# Patient Record
Sex: Male | Born: 1940 | Race: White | Hispanic: No | Marital: Married | Smoking: Never smoker
Health system: Southern US, Community
[De-identification: ages and names within clinical notes are randomized; demographics above are authoritative.]

## PROBLEM LIST (undated history)

## (undated) DIAGNOSIS — E785 Hyperlipidemia, unspecified: Secondary | ICD-10-CM

## (undated) DIAGNOSIS — E871 Hypo-osmolality and hyponatremia: Secondary | ICD-10-CM

## (undated) DIAGNOSIS — I2510 Atherosclerotic heart disease of native coronary artery without angina pectoris: Secondary | ICD-10-CM

## (undated) DIAGNOSIS — E119 Type 2 diabetes mellitus without complications: Secondary | ICD-10-CM

## (undated) DIAGNOSIS — R42 Dizziness and giddiness: Secondary | ICD-10-CM

## (undated) HISTORY — DX: Type 2 diabetes mellitus without complications: E11.9

## (undated) HISTORY — DX: Dizziness and giddiness: R42

## (undated) HISTORY — DX: Hyperlipidemia, unspecified: E78.5

## (undated) HISTORY — DX: Hypo-osmolality and hyponatremia: E87.1

## (undated) HISTORY — DX: Atherosclerotic heart disease of native coronary artery without angina pectoris: I25.10

---
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
## 2006-10-08 ENCOUNTER — Emergency Department: Payer: Self-pay | Admitting: Emergency Medicine

## 2007-03-21 ENCOUNTER — Ambulatory Visit: Payer: Self-pay | Admitting: Cardiology

## 2007-09-27 ENCOUNTER — Ambulatory Visit: Payer: Self-pay | Admitting: Cardiology

## 2007-09-28 ENCOUNTER — Ambulatory Visit: Payer: Self-pay | Admitting: Cardiology

## 2007-09-28 ENCOUNTER — Inpatient Hospital Stay (HOSPITAL_BASED_OUTPATIENT_CLINIC_OR_DEPARTMENT_OTHER): Admission: RE | Admit: 2007-09-28 | Discharge: 2007-09-28 | Payer: Self-pay | Admitting: Cardiology

## 2007-10-12 ENCOUNTER — Ambulatory Visit: Payer: Self-pay | Admitting: Cardiology

## 2008-05-31 ENCOUNTER — Ambulatory Visit: Payer: Self-pay | Admitting: General Surgery

## 2008-06-14 ENCOUNTER — Ambulatory Visit: Payer: Self-pay | Admitting: General Surgery

## 2008-10-23 ENCOUNTER — Ambulatory Visit: Payer: Self-pay | Admitting: Cardiology

## 2008-12-28 ENCOUNTER — Encounter: Payer: Self-pay | Admitting: Cardiology

## 2009-04-23 ENCOUNTER — Ambulatory Visit: Payer: BLUE CROSS/BLUE SHIELD

## 2009-05-02 ENCOUNTER — Ambulatory Visit: Payer: BLUE CROSS/BLUE SHIELD | Admitting: Urology

## 2009-05-26 ENCOUNTER — Emergency Department: Payer: BLUE CROSS/BLUE SHIELD | Admitting: Emergency Medicine

## 2009-06-18 ENCOUNTER — Encounter: Payer: Self-pay | Admitting: Cardiology

## 2009-07-11 DIAGNOSIS — I2510 Atherosclerotic heart disease of native coronary artery without angina pectoris: Secondary | ICD-10-CM

## 2009-12-10 ENCOUNTER — Ambulatory Visit: Payer: Self-pay | Admitting: Cardiovascular Disease

## 2009-12-10 DIAGNOSIS — E119 Type 2 diabetes mellitus without complications: Secondary | ICD-10-CM | POA: Insufficient documentation

## 2010-04-01 ENCOUNTER — Encounter: Payer: Self-pay | Admitting: Cardiovascular Disease

## 2010-04-09 ENCOUNTER — Ambulatory Visit: Payer: Self-pay | Admitting: Cardiovascular Disease

## 2010-04-09 DIAGNOSIS — R42 Dizziness and giddiness: Secondary | ICD-10-CM

## 2010-04-09 DIAGNOSIS — E871 Hypo-osmolality and hyponatremia: Secondary | ICD-10-CM

## 2010-04-25 ENCOUNTER — Ambulatory Visit: Payer: BLUE CROSS/BLUE SHIELD | Admitting: Nephrology

## 2010-05-06 ENCOUNTER — Ambulatory Visit: Payer: BLUE CROSS/BLUE SHIELD | Admitting: Nephrology

## 2010-09-25 ENCOUNTER — Encounter: Payer: Self-pay | Admitting: Cardiovascular Disease

## 2010-09-30 ENCOUNTER — Encounter: Payer: Self-pay | Admitting: Cardiovascular Disease

## 2010-09-30 ENCOUNTER — Ambulatory Visit
Admission: RE | Admit: 2010-09-30 | Discharge: 2010-09-30 | Payer: Self-pay | Source: Home / Self Care | Attending: Cardiovascular Disease | Admitting: Cardiovascular Disease

## 2010-09-30 DIAGNOSIS — E785 Hyperlipidemia, unspecified: Secondary | ICD-10-CM | POA: Insufficient documentation

## 2010-10-14 NOTE — Progress Notes (Signed)
Summary: Office Visit  Office Visit   Imported By: [place] Carbo 04/11/2010 10:34:20  _____________________________________________________________________  External Attachment:    Type:   Image     Comment:   External Document

## 2010-10-14 NOTE — Consult Note (Signed)
Summary: Consultation Report  Consultation Report   Imported By: Harlon Flor 04/23/2010 09:42:25  _____________________________________________________________________  External Attachment:    Type:   Image     Comment:   External Document

## 2010-10-14 NOTE — Assessment & Plan Note (Signed)
Summary: EC6   Visit Type:  ec6 Referring Provider:  Daleen Squibb Primary Provider:  Blythe Stanford, MD  CC:  NO COMPLAINTS.  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 He has a history of nonobstructive coronary artery disease, mild irregularities in the LAD, 30% in the circumflex. Catheterization was performed for chronic dizziness. 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  His dizziness episodes have improved recently. He states that sometimes it waxes and wanes. Currently he feels well though he is having trouble with irritable bowel syndrome. He seems to have periods of loose stools and constipation. He has been tried to take fiber for his symptoms.  He denies any chest pain, shortness of breath and is otherwise active. His weight has increased a little but recently through the winter season as he has been less active.  Current Problems (verified): 1)  Cad, Native Vessel  (ICD-414.01)  Current Medications (verified): 1)  Aspirin Ec 325 Mg Tbec (Aspirin) .... Take One Tablet By Mouth Daily 2)  Lisinopril 5 Mg Tabs (Lisinopril) .... Take One Tablet By Mouth Daily 3)  Metformin Hcl 500 Mg Tabs (Metformin Hcl) .Marland Kitchen.. 1 By Mouth Two Times A Day 4)  Omeprazole 20 Mg Cpdr (Omeprazole) .Marland Kitchen.. 1 By Mouth Once Daily 5)  Seroquel 25 Mg Tabs (Quetiapine Fumarate) .Marland Kitchen.. 1 By Mouth Once Daily 6)  Simvastatin 20 Mg Tabs (Simvastatin) .... Take One Tablet By Mouth Daily At Bedtime  Allergies (verified): No Known Drug Allergies  Past History:  Past Medical History: Last updated: 07/11/2009 CAD, NATIVE VESSEL (ICD-414.01)    Family History: Last updated: 07/11/2009 Family History of CVA or Stroke:   Social History: Last updated: 07/11/2009 Full Time Married  Tobacco Use - No.  Alcohol Use - yes Regular Exercise - no Drug Use - no  Risk Factors: Exercise: no (07/11/2009)  Risk Factors: Smoking Status: never  (07/11/2009)  Review of Systems       The patient complains of weight gain.  The patient denies fever, vision loss, decreased hearing, hoarseness, chest pain, syncope, dyspnea on exertion, peripheral edema, prolonged cough, abdominal pain, incontinence, muscle weakness, depression, and enlarged lymph nodes.    Vital Signs:  Patient profile:   70 year old male Height:      69 inches Weight:      185.75 pounds BMI:     27.53 Pulse rate:   69 / minute Pulse rhythm:   regular BP sitting:   132 / 70  (left arm) Cuff size:   regular  Vitals Entered By: Mercer Pod (December 10, 2009 10:34 AM)  Physical Exam  General:  well-appearing gentleman in no apparent distress, HEENT exam is benign, oropharynx is clear, neck is supple with no JVP or carotid bruits, heart sounds are regular with S1-S2 and no murmurs appreciated, lungs are clear to auscultation with no wheezes Rales, abdominal exam is benign with no widened aortic pulsation, no significant lower extremity edema, pulses are equal and symmetrical in his upper and lower extremities, neurologic exam is grossly nonfocal.   EKG  Procedure date:  12/10/2009  Findings:      Normal sinus rhythm with rate 69 beats per minute, no significant ST or T wave changes.  Impression & Recommendations:  Problem # 1:  CAD, NATIVE VESSEL (ICD-414.01) history of nonobstructive coronary artery disease by cardiac catheter several years ago. Echocardiogram at that time showing normal systolic function, diastolic relaxation abnormality. He is asymptomatic and  feels well and is tolerating his simvastatin and aspirin with no problems. Also on low-dose lisinopril for his blood pressures well controlled.  We'll try to obtain a copy of his most recent lipid panel for her records.  His updated medication list for this problem includes:    Aspirin Ec 325 Mg Tbec (Aspirin) .Marland Kitchen... Take one tablet by mouth daily    Lisinopril 5 Mg Tabs (Lisinopril) .Marland Kitchen... Take  one tablet by mouth daily  Problem # 2:  DM (ICD-250.00) diabetes is being managed by Dr. Elease Hashimoto. We have given him a dietary guide to help with food selection.  His updated medication list for this problem includes:    Aspirin Ec 325 Mg Tbec (Aspirin) .Marland Kitchen... Take one tablet by mouth daily    Lisinopril 5 Mg Tabs (Lisinopril) .Marland Kitchen... Take one tablet by mouth daily    Metformin Hcl 500 Mg Tabs (Metformin hcl) .Marland Kitchen... 1 by mouth two times a day

## 2010-10-14 NOTE — Assessment & Plan Note (Signed)
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 He has a history of nonobstructive coronary artery disease, mild irregularities in the LAD, 30% in the circumflex. Catheterization was performed for chronic dizziness in 2009. 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  He reports that over the past 5 weeks, he has felt weak. Initially it started with a episode  of shaking all over his body though his wife did not see him shaking. He felt it in inside. it lasted for several seconds before it resolved. Since then, he reports he has not felt as well. He has had symptoms of polyuria , sometimes urinating every 45 minutes at nighttime. It has significantly disrupted his sleep hygiene and he feels exhausted. 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  He did have a second episode recently when he was sitting in a chair of dizziness lasting for several seconds. He got up and it seemed to ease off on its own. That episode seemed very similar to his previous episodes of Mnire's disease.  He denies any chest pain, shortness of breath and is otherwise active.   Current Medications (verified): 1)  Aspirin Ec 325 Mg Tbec (Aspirin) .... Take One Tablet By Mouth Daily 2)  Lisinopril 5 Mg Tabs (Lisinopril) .... Take One Tablet By Mouth Daily 3)  Metformin Hcl 500 Mg Tabs (Metformin Hcl) .Kurt Chapman.. 1 By Mouth Two Times A Day 4)  Omeprazole 20 Mg Cpdr (Omeprazole) .Kurt Chapman.. 1 By Mouth Once Daily 5)  Simvastatin 20 Mg Tabs (Simvastatin) .... Take One Tablet By Mouth Daily At Bedtime 6)  Ciprofloxacin Hcl 500 Mg Tabs (Ciprofloxacin Hcl) .Kurt Chapman.. 1 Two Times A Day 7)  Rapaflo 8 Mg Caps (Silodosin) .Kurt Chapman.. 1 Once Daily  Allergies (verified): No Known Drug Allergies  Past  History:  Past Medical History: Last updated: 07/11/2009 CAD, NATIVE VESSEL (ICD-414.01)    Family History: Last updated: 07/11/2009 Family History of CVA or Stroke:   Social History: Last updated: 07/11/2009 Full Time Married  Tobacco Use - No.  Alcohol Use - yes Regular Exercise - no Drug Use - no  Risk Factors: Exercise: no (07/11/2009)  Risk Factors: Smoking Status: never (07/11/2009)  Review of Systems  The patient denies fever, weight loss, weight gain, vision loss, decreased hearing, hoarseness, chest pain, syncope, dyspnea on exertion, peripheral edema, prolonged cough, abdominal pain, incontinence, muscle weakness, depression, and enlarged lymph nodes.         Dizziness  Vital Signs:  Patient profile:   70 year old male Height:      69 inches Weight:      181 pounds BMI:     26.83 Pulse rate:   72 / minute Pulse (ortho):   76 / minute BP sitting:   135 / 58  (right arm) BP standing:   149 / 78 Cuff size:   regular  Vitals Entered By: Bishop Dublin, CMA (April 09, 2010 2:23 PM)  Serial Vital Signs/Assessments:  Time      Position  BP       Pulse  Resp  Temp     By           Lying RA  131/78   71  Bishop Dublin, CMA           Sitting   151/84   [address] [place]           Standing  149/78   76                    Bishop Dublin, [place]  Comments: 2 min. BP 145/78  HR 76 4 min. BP 151/79  HR 78  lying to sitting lightheaded.  Got better at 2 min.  By: Bishop Dublin, CMA    Physical Exam  General:  Well developed, well nourished, in no acute distress. Head:  normocephalic and atraumatic Neck:  Neck supple, no JVD. No masses, thyromegaly or abnormal cervical nodes. Lungs:  Clear bilaterally to auscultation and percussion. Heart:  Non-displaced PMI, chest non-tender; regular rate and rhythm, S1, S2 without murmurs, rubs or gallops. Carotid upstroke normal, no bruit.  Pedals normal pulses. No edema, no  varicosities. Abdomen:  Bowel sounds positive; abdomen soft and non-tender without masses Msk:  Back normal, normal gait. Muscle strength and tone normal. Pulses:  pulses normal in all 4 extremities Extremities:  No clubbing or cyanosis. Neurologic:  Alert and oriented x 3. Skin:  Intact without lesions or rashes. Psych:  Normal affect.   Impression & Recommendations:  Problem # 1:  DIZZINESS (ICD-780.4) etiology of his recent weakness is likely due to significant sleep disruption from his prostate. He has not had a good night sleep in quite some time. He did have one episode of jerking which he felt inside 5 weeks ago. This makes me concerned about some kind of infection. he is currently on ciprofloxacin for his prostate.  He had a second episode of dizziness recently which is more consistent with his Mnire's disease. His underlying fatigue and weakness is likely due to poor sleep.  I have suggested to him that if his symptoms of dizziness persists, we could do an event monitor. I will prefer to wait until his sleep has improved and his prostate issue is also improved. This could be part of the problem.  Problem # 2:  CAD, NATIVE VESSEL (ICD-414.01) history of nonobstructive coronary artery disease by cardiac catheterization 2 years ago. No further indication for stress testing at this time as he is otherwise asymptomatic with exertion.  His updated medication list for this problem includes:    Aspirin Ec 325 Mg Tbec (Aspirin) .Kurt Chapman... Take one tablet by mouth daily    Lisinopril 5 Mg Tabs (Lisinopril) .Kurt Chapman... Take one tablet by mouth daily  Problem # 3:  HYPONATREMIA (ICD-276.1)  Lab work over the past 2 months has shown hyponatremia with a low sodium of 127, most recently in the low 130s. I do not think that this is contributing to his current symptoms. His creatinine is normal. Potassium is also normal. I suggested that he increase his salt intake with salty peanuts or diabetic  family foods.  Patient Instructions: 1)  Your physician wants you to follow-up in:   6 months You will receive a reminder letter in the mail two months in advance. If you don't receive a letter, please call our office to schedule the follow-up appointment. 2)  Increase sodium in diet.

## 2010-10-16 NOTE — Assessment & Plan Note (Signed)
Summary: ROV/AMD   Visit Type:  Follow-up Referring Provider:  Daleen Squibb Primary Provider:  Lorie Phenix, MD  CC:  c/o occasional chest pains. denies SOB and palpitations..  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 He has a history of nonobstructive coronary artery disease, mild irregularities in the LAD, 30% in the circumflex. Catheterization was performed for chronic dizziness in 2009. 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  He did have an episode of dizziness last week lasting for 30 minutes. It started after he stood up. Typically it does not resolve if he sits back down or lies flat. It has been several months since his last episode of dizziness. He does not believe it is arrhythmia or his heart as he's had significant workup and believes it is from his inner ear.  He also reports some chest discomfort on the left side, radiating to the right side. 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 He had to put his arms above his head for prolonged periods of time. He believes he strained some muscles. He has not had any further episodes  He denies any chest pain, shortness of breath and is otherwise active.   EKG shows normal sinus rhythm with rate 69 beats per minute, poor R wave progression through the precordial anterior leads  Current Medications (verified): 1)  Aspirin Ec 325 Mg Tbec (Aspirin) .... Take One Tablet By Mouth Daily 2)  Lisinopril 5 Mg Tabs (Lisinopril) .... Take One Tablet By Mouth Daily 3)  Metformin Hcl 500 Mg Tabs (Metformin Hcl) .Marland Kitchen.. 1 By Mouth Two Times A Day 4)  Omeprazole 20 Mg Cpdr (Omeprazole) .Marland Kitchen.. 1 By Mouth Once Daily 5)  Simvastatin 20 Mg Tabs (Simvastatin) .... Take One Tablet By Mouth Daily At Bedtime 6)  Seroquel 25 Mg Tabs (Quetiapine Fumarate) .Marland Kitchen.. 1 Tablet Daily  Allergies (verified): No Known Drug Allergies  Past History:  Past Medical  History: Last updated: 07/11/2009 CAD, NATIVE VESSEL (ICD-414.01)    Family History: Last updated: 07/11/2009 Family History of CVA or Stroke:   Social History: Last updated: 07/11/2009 Full Time Married  Tobacco Use - No.  Alcohol Use - yes Regular Exercise - no Drug Use - no  Risk Factors: Exercise: no (07/11/2009)  Risk Factors: Smoking Status: never (07/11/2009)  Vital Signs:  Patient profile:   70 year old male Height:      69 inches Weight:      187 pounds BMI:     27.71 Pulse rate:   68 / minute BP sitting:   127 / 77  (left arm) Cuff size:   regular  Vitals Entered By: Lysbeth Galas CMA (September 30, 2010 10:42 AM)  Physical Exam  General:  Well developed, well nourished, in no acute distress. Head:  normocephalic and atraumatic Neck:  Neck supple, no JVD. No masses, thyromegaly or abnormal cervical nodes. Lungs:  Clear bilaterally to auscultation and percussion. Heart:  Non-displaced PMI, chest non-tender; regular rate and rhythm, S1, S2 without murmurs, rubs or gallops. Carotid upstroke normal, no bruit.  Pedals normal pulses. No edema, no varicosities. Abdomen:  Bowel sounds positive; abdomen soft and non-tender without masses Msk:  Back normal, normal gait. Muscle strength and tone normal. Pulses:  pulses normal in all 4 extremities Extremities:  No clubbing or cyanosis. Neurologic:  Alert and oriented x 3. Skin:  Intact without lesions or rashes. Psych:  Normal affect.  Impression & Recommendations:  Problem # 1:  DIZZINESS (ICD-780.4) symptoms are consistent with his inner ear. Have asked his wife to check his blood pressure and heart rate when he has additional episodes of dizziness to ensure that he is not orthostatic, and he does not have arrhythmia. He has had monitors in the past. We will not order any additional testing at this time.  Problem # 2:  CAD, NATIVE VESSEL (ICD-414.01) he had minimal coronary artery disease by catheterization 2  years ago. His most recent chest pain is consistent with muscular skeletal etiology. He was straining the day prior. I've asked him to contact me if he has any additional symptoms with exertion over the next several weeks. We will decrease his aspirin to 81 mg x2.  His updated medication list for this problem includes:    Aspirin 81 Mg Tbec (Aspirin) .Marland Kitchen... Take one tablet by mouth twice daily    Lisinopril 5 Mg Tabs (Lisinopril) .Marland Kitchen... Take one tablet by mouth daily  Orders: EKG w/ Interpretation (93000)  Problem # 3:  DM (ICD-250.00) He does report that his sugars are mildly elevated. His diet has been a little bit off track. He will work on his diet and weight.  His updated medication list for this problem includes:    Aspirin 81 Mg Tbec (Aspirin) .Marland Kitchen... Take one tablet by mouth daily    Lisinopril 5 Mg Tabs (Lisinopril) .Marland Kitchen... Take one tablet by mouth daily    Metformin Hcl 500 Mg Tabs (Metformin hcl) .Marland Kitchen... 1 by mouth two times a day  Problem # 4:  HYPERLIPIDEMIA-MIXED (ICD-272.4) He reports total cholesterol 133 which is clearly at goal. We'll try to obtain his most recent lipid panel from Dr. Elease Hashimoto.  His updated medication list for this problem includes:    Simvastatin 20 Mg Tabs (Simvastatin) .Marland Kitchen... Take one tablet by mouth daily at bedtime  Patient Instructions: 1)  Your physician recommends that you schedule a follow-up appointment in: 1 year 2)  Your physician has recommended you make the following change in your medication: DECREASE Aspirin to 81mg  once daily.

## 2011-01-27 NOTE — Cardiovascular Report (Signed)
NAMECAMARION, WEIER                ACCOUNT NO.:  [id number]   MEDICAL RECORD NO.:  [id number]          PATIENT TYPE:  OIB   LOCATION:  1967                         FACILITY:  [facility]   PHYSICIAN:  Bruce R. 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  He has a recently diagnosed hypertension and diabetes.  He has  recently developed symptoms of shortness of breath with exertion.  He  has also had recent bronchitis.  He had a stress test performed by Dr.  Gwen Pounds which showed no evidence of ischemia, but because of persistent  symptoms of dyspnea on exertion and positive risk factors, Dr. Daleen Squibb, who  saw him in consultation, felt that coronary angiography was indicated.   PROCEDURE:  The procedure was performed via the right femoral artery  using arterial sheath and 4-French preformed coronary catheters.  After  completion of the left heart cath, we decided to do a right heart cath,  and this was performed via the right femoral vein using the VSC and Swan-  Ganz thermodilution catheter.  The patient tolerated the procedure well  and left the laboratory in satisfactory condition.   RESULTS:  The left main coronary artery:  The left main coronary artery  was free of significant disease.   Left anterior descending artery:  The left anterior descending artery  gave rise to three diagonal branches and two septal perforators.  The  artery was irregular, but there was no significant obstruction.   The circumflex artery:  The circumflex artery was a large dominant  vessel that supplied a large marginal branch, a small marginal branch,  two posterolateral branches and a posterior branch.  The circumflex was  irregular.  There was 30% narrowing in the midvessel.   The right coronary artery:  The right coronary artery  was a nondominant  vessel that appeared to be free of significant disease.  This was a sub  selective injection.   LEFT VENTRICULOGRAM:  The left ventriculogram performed in the RAO  projection showed good wall motion with no areas of hypokinesis.  Estimated ejection fraction was 60%.   HEMODYNAMIC DATA:  Right atrial pressure was 3 mean.  Pulmonary artery  pressure was 24/9 with a mean of 15.  Pulmonary wedge pressure was 9  mean.  Left ventricular pressure was 120/7.  Aortic pressure 120/65 with  mean of 85.  Cardiac output/cardiac index by FICK was 4.3/2.3 m/min/m2.   CONCLUSION:  Minimal nonobstructive coronary artery disease with  irregularities in the LAD, 30% narrowing in the mid circumflex artery,  and no significant obstruction in the small nondominant right coronary  with normal LV function.   RECOMMENDATIONS:  Reassurance.  In view of these findings, I do not  believe Mr. Difatta symptoms are related to ischemia.  We also found no  evidence of pulmonary hypertension.  He has had recent bronchitis, this  may explain his symptoms.  Will have  him follow-up with Dr. Elease Hashimoto and  Dr. Daleen Squibb.      Bruce Elvera Lennox Juanda Chance, MD, [facility]  Electronically Signed     BRB/MEDQ  D:  09/28/2007  T:  09/28/2007  Job:  [id number]   cc:   Thomas C. Wall, MD, [facility]  Lorie Phenix  Cardiopulmonary Lab

## 2011-01-27 NOTE — Assessment & Plan Note (Signed)
[name] OFFICE NOTE   [name], [name]                       MRN:          [id number]  DATE:10/12/2007                            DOB:          02/04/1941    Kurt Chapman returns today after having a cardiac catheterization for  dyspnea on exertion, multiple cardiac risk factors.  He had minimal  nonobstructive disease with irregularity in the LAD, 30% in a mid  circumflex vessel and no significant disease in a small nondominant  right.  Normal left ventricular function.  We also performed a right  heart cath because of his shortness of breath and his cardiac output was  normal.  Pulmonary pressures were normal.  His wedge pressure was  normal.   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  We went  over that at length today and how to avoid that.  He has very high-tech  tractors that allow him to not turn to look at his implements, and use  sophisticated mirrors.  I have also given him orthostatic precautions.   He is on excellent program with Dr. Elease Hashimoto.  His lipid panel on Zocor  was at goal, his hemoglobin A1c is at goal 5.7, and he is also on  lisinopril low-dose and aspirin.   His blood pressure is 118/70.  His pulse 78 and regular, weight is 165.  The rest of the exam is unchanged.  He is a nonsmoker.   Kurt Chapman is doing well from our standpoint.  He is on excellent  secondary preventative therapy.  We talked about how to avoid  orthostatic symptoms and also perhaps some vertebral insufficiency from  turning his head too quickly.  I will see him back in a year just for  updates with cardiovascular medicine and any changes that might need to  be implemented.  Dr.  Elease Hashimoto is obviously doing an excellent job with  this.     Thomas C. Daleen Squibb, MD, [facility]  Electronically Signed    TCW/MedQ  DD: 10/12/2007  DT: 10/12/2007  Job #: [id number]   cc:   Lorie Phenix

## 2011-01-27 NOTE — Assessment & Plan Note (Signed)
[facility] OFFICE NOTE   Kurt Chapman, Kurt Chapman                       MRN:          [id number]  DATE:10/23/2008                            DOB:          1940-12-07    Mr. Alcock comes in today for followup of his nonobstructive coronary  disease.  He is having no angina or ischemic symptoms.  He does have his  baseline dyspnea on exertion.   His biggest problems continue to be difficulty with intermittent  dizziness.  He is going to get a second opinion at [facility] which  is highly recommended to do.   He stopped his simvastatin about a month ago because of stomach  problems.  He apparently have some gastric polyps removed by Dr. Doristine Counter  and some diverticulosis with irritable bowel.  He has of course been  encouraged to take this if possible.  He is on MiraLax which seems to  help.   MEDICATIONS:  His only cardiac meds otherwise are aspirin 81 mg a day,  lisinopril 5 mg a day.  Of course, he is on his diabetic regimen.   PHYSICAL EXAMINATION:  VITAL SIGNS:  His blood pressure today is 130/80,  his pulse 68 and regular, his weight is 168.  Rest of his exam is really  unchanged.  NECK:  Specifically, he has no carotid bruits.  Thyroid is not enlarged.  Trachea is midline.  LUNGS:  Clear to auscultation and percussion.  HEART:  Nondisplaced PMI.  Normal S1 and S2.  ABDOMEN:  Soft, good bowel sounds.  No midline bruit.  EXTREMITIES:  No cyanosis, clubbing, or edema.  Pulses are intact.   Electrocardiogram is normal.   I had a long talk with Mr. and Ms. Riede.  I hope he can get some help  with his dizziness and vertigo at [facility].  I have made no  changes to his medical program.  I will see him back in a year.     Thomas C. Daleen Squibb, MD, [facility]  Electronically Signed    TCW/MedQ  DD: 10/23/2008  DT: 10/24/2008  Job #: [id number]

## 2011-01-27 NOTE — Assessment & Plan Note (Signed)
[facility] OFFICE NOTE   ROMANO, STIGGER                       MRN:          [id number]  DATE:03/21/2007                            DOB:          1940-12-11    [name] is a delightful 70 year old married white male, who  comes today with his wife, for follow up of recent stress test and  problems with chronic dizziness.  He was actually referred through a  friend of mine, Mr Bea Graff. Colette Ribas and asked Dr. Elease Hashimoto to have him see me.   He has had no previous cardiac history.  He has had no previous history  of cerebrovascular disease.  He has carried a diagnosis of Meniere's  which is followed and treated by Dr. Rulon Eisenmenger in [facility].  About 6  weeks ago, while he was trying to shave early in the morning, he began  to fall to the left.  He was taken to [facility] and apparently had  extensive neurological evaluation, none of which I have records of  today.  He clearly had MRIs and CT scans and his wife and he say he had  no sign of a stroke.   He came home and was subsequently setup for an exercise stress test  which was done by Dr. Gwen Pounds because of his age, cardiac risk factors  and this event.   This study was remarkable for good exercise tolerance, no ischemia on  his EKG. Normal left ventricular systolic function with an EF of 61% and  normal myocardial perfusion with no ischemia.   His risk factors are age, sex, and hyperlipidemia. He also has recently  been diagnosed with type 2 diabetes.   PAST MEDICAL HISTORY:   MEDICATIONS:  1. Zocor 10 mg daily.  2. Metformin 500 b.i.d.  3. Valium p.r.n.  4. Zantac 300 mg p.r.n.  5. Baby aspirin 81 mg daily.  6. Meclizine p.r.n.   He has no known drug allergies.   He does not smoke, he does have 1-2 alcoholic beverages occasionally.   He has had no previous surgery.   FAMILY HISTORY:  Negative for premature coronary disease.   SOCIAL  HISTORY:  He is self-employed.  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  He is married and has 2  children.  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   REVIEW OF SYSTEMS:  Other than the HPI he does have a history of reflux  and hiatal hernia.   Recent blood work demonstrated total cholesterol of 120, triglycerides  of 67, HDL 46, LDL 61, hemoglobin A1C of 2.1%.  Normal creatinine,  normal potassium.  His LFTs were normal.   EXAM:  He is very pleasant.  He is 5 feet 9 inches, weighs 164 pounds.  His blood pressure is 122/73,  his pulse 69 and regular.  His EKG is essentially normal.  HEENT:  Normocephalic, atraumatic.  PERRLA.  Extraocular movements  intact.  Sclerae clear.  Facial  asymmetry is normal.  NECK:  Supple.  Carotid upstrokes were equal bilaterally without bruits.  There is no JVD.  Thyroid is not enlarged.  Trachea is midline.  LUNGS:  Clear.  HEART:  A soft S1, S2.  S2 split physiologically.  There are no murmurs.  ABDOMEN:  Soft, good bowel sounds.  No midline bruit.  EXTREMITIES:.  With no cyanosis, clubbing or edema.  Pulses are brisk.  NEURO:  Intact.   We checked orthostatic blood pressures on Mr. Barnhart and they were normal  and actually increase with standing.   ASSESSMENT/PLAN:  I had a long talk with Mr. Fanning and his wife today.  I do not think his event that he had 6 or 7 weeks ago was anything more  than a flare of his Meniere's disease.  We have talked about cardiac  risk factors and the importance of keeping his cholesterol and  hemoglobin A1C at the excellent values they are at, at present.  I have  also emphasized how to respond to angina or an acute coronary syndrome.   I will plan on seeing him back per his request about every 2 years, to  discuss any new trends in cardiovascular medicine.     Thomas C. Daleen Squibb, MD, [facility]  Electronically Signed    TCW/MedQ  DD: 03/21/2007  DT:  03/21/2007  Job #: [id number]   cc:   Effie Shy, M.D. in [place]

## 2011-01-27 NOTE — Assessment & Plan Note (Signed)
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   HISTORY OF PRESENT ILLNESS:  [name] is a 70 year old married  white male whom I evaluated in the office on March 21, 2007.  At that time  he had had an episode of atypical chest discomfort and had an exercise  stress test with Dr. Gwen Pounds.  This showed no ischemia on his EKG,  normal left ventricular function, EF 61% with normal perfusion and no  ischemia.  He also had good exercise tolerance.   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  He is a large dairy and Patent attorney.  He feels like he can  not get a deep breath and has had some chest pressure.   He had been battling bronchitis the last 3 weeks and has now been  switched to amoxicillin and now to Levaquin by Dr. Elease Hashimoto.  He feels  like this is much more than bronchitis.   He has multiple cardiac risk factors including age, sex, type 2 diabetes  and hyperlipidemia.   PAST MEDICAL HISTORY:  MEDICINES:  1. Simvastatin 20 mg a day.  2. Lisinopril 10 mg a day.  3. Levaquin 500 mg daily.  4. Meclizine p.r.n.  5. Baby aspirin 81 mg a day.  6. Zantac 300 mg p.r.n.  7. Valium p.r.n.  8. Metformin 500 mg p.o. b.i.d. which he took this morning.   HE HAS NO KNOWN DRUG ALLERGIES.  NO DYE ALLERGIES.  He does not smoke,  he has 1-2 alcoholic beverages occasionally.  He had no previous  surgery.   FAMILY HISTORY:  Negative for premature coronary disease.   SOCIAL HISTORY:  He is self-employed.  He is a large dairy and Product/process development scientist.  He is married and has 2 children.  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   REVIEW OF SYSTEMS:  He does have a history of some gastroesophageal  reflux and hiatal hernia.   PHYSICAL EXAMINATION:  He is extraordinarily pleasant.  He is with his  wife.  He looks worried.  Respiratory rate is 18 and unlabored, blood  pressure is 120/70, his pulse 72 and regular, his weight is 163.  He is  alert and oriented x3.  He is 5 foot 9 inches.  HEENT:  Normocephalic/atraumatic, PERRLA, extraocular movements intact,  sclera are clear, facial symmetry is normal.  NECK:  Supple, carotid upstrokes were equal bilateral without bruits, no  JVD, thyroid is not enlarged, trachea is midline.  LUNGS:  Clear.  HEART:  Reveals a normal S1-S2, S2 splits physiologically, there are no  murmurs.  ABDOMINAL:  Soft,  good bowel sounds, no midline bruit.  EXTREMITIES:  Reveal no cyanosis, clubbing or edema.  Pulses are brisk.  NEURO:  Exam is intact.   ASSESSMENT/PLAN:  I am very concerned that Mr. Esper has obstructive  coronary disease.  I think there is a possibility we may have missed  something on the stress study he had last summer.   I have recommended outpatient JV cath.  Indications, risks, potential  benefits have been discussed.  He agrees to proceed.  We will arrange  this for tomorrow, Wednesday, with Dr. Charlies Constable.     Thomas C. Daleen Squibb, MD, [facility]  Electronically Signed    TCW/MedQ  DD: 09/27/2007  DT: 09/27/2007  Job #: [id number]   cc:   Lorie Phenix

## 2011-02-18 ENCOUNTER — Emergency Department: Payer: BLUE CROSS/BLUE SHIELD | Admitting: Internal Medicine

## 2011-03-02 ENCOUNTER — Encounter: Payer: Self-pay | Admitting: Cardiovascular Disease

## 2011-06-04 LAB — POCT I-STAT 3, ART BLOOD GAS (G3+)
Bicarbonate: 22.5
O2 Saturation: 97
pCO2 arterial: 37.7
pO2, Arterial: 87

## 2011-06-04 LAB — POCT I-STAT 3, VENOUS BLOOD GAS (G3P V)
Acid-base deficit: 2
O2 Saturation: 74
TCO2: 25
pCO2, Ven: 40.6 — ABNORMAL LOW

## 2011-10-08 ENCOUNTER — Ambulatory Visit: Payer: Self-pay | Admitting: Internal Medicine

## 2011-10-12 ENCOUNTER — Ambulatory Visit: Payer: Self-pay | Admitting: Cardiovascular Disease

## 2011-10-22 ENCOUNTER — Ambulatory Visit: Payer: Self-pay | Admitting: Cardiovascular Disease

## 2011-11-04 ENCOUNTER — Encounter: Payer: Self-pay | Admitting: *Deleted

## 2011-11-06 ENCOUNTER — Ambulatory Visit (INDEPENDENT_AMBULATORY_CARE_PROVIDER_SITE_OTHER): Payer: Medicare Other | Admitting: Cardiovascular Disease

## 2011-11-06 ENCOUNTER — Encounter: Payer: Self-pay | Admitting: Cardiovascular Disease

## 2011-11-06 VITALS — BP 120/72 | HR 72 | Ht 70.0 in | Wt 174.0 lb

## 2011-11-06 DIAGNOSIS — I2510 Atherosclerotic heart disease of native coronary artery without angina pectoris: Secondary | ICD-10-CM

## 2011-11-06 DIAGNOSIS — E119 Type 2 diabetes mellitus without complications: Secondary | ICD-10-CM

## 2011-11-06 DIAGNOSIS — E785 Hyperlipidemia, unspecified: Secondary | ICD-10-CM

## 2011-11-06 NOTE — Assessment & Plan Note (Signed)
Currently with no symptoms of angina. No further workup at this time. Continue current medication regimen. 

## 2011-11-06 NOTE — Progress Notes (Signed)
   Patient ID: Kurt Chapman, male    DOB: [birth year], 71 y.o.   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 He has a history of nonobstructive coronary artery disease, mild irregularities in the LAD, 30% in the circumflex. Catheterization was performed for chronic dizziness in 2009. 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   No recent episodes of dizziness. He does have polyuria during the daytime. No significant shortness of breath or chest discomfort with exertion.  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     EKG shows normal sinus rhythm with rate 72 beats per minute, poor R wave progression through the precordial anterior leads    Outpatient Encounter Prescriptions as of 11/06/2011  Medication Sig Dispense Refill  . amLODipine-benazepril (LOTREL) 5-10 MG per capsule Take 1 capsule by mouth daily.      Marland Kitchen aspirin 81 MG EC tablet Take 81 mg by mouth daily.        Marland Kitchen omeprazole (PRILOSEC) 20 MG capsule Take 20 mg by mouth daily.        . QUEtiapine (SEROQUEL) 25 MG tablet Take 25 mg by mouth at bedtime.        . simvastatin (ZOCOR) 20 MG tablet Take 20 mg by mouth at bedtime.           Review of Systems  Constitutional: Negative.   HENT: Negative.   Eyes: Negative.   Respiratory: Negative.   Cardiovascular: Negative.   Gastrointestinal: Negative.   Musculoskeletal: Negative.        Muscle cramps  Skin: Negative.   Neurological: Negative.   Hematological: Negative.   Psychiatric/Behavioral: Negative.   All other systems reviewed and are negative.    BP 120/72  Pulse 72  Ht 5\' 10"  (1.778 m)  Wt 174 lb (78.926 kg)  BMI 24.97 kg/m2  Physical Exam  Nursing note and vitals reviewed. Constitutional: He is oriented to person, place, and time. He appears well-developed and well-nourished.  HENT:    Head: Normocephalic.  Nose: Nose normal.  Mouth/Throat: Oropharynx is clear and moist.  Eyes: Conjunctivae are normal. Pupils are equal, round, and reactive to light.  Neck: Normal range of motion. Neck supple. No JVD present.  Cardiovascular: Normal rate, regular rhythm, S1 normal, S2 normal, normal heart sounds and intact distal pulses.  Exam reveals no gallop and no friction rub.   No murmur heard. Pulmonary/Chest: Effort normal and breath sounds normal. No respiratory distress. He has no wheezes. He has no rales. He exhibits no tenderness.  Abdominal: Soft. Bowel sounds are normal. He exhibits no distension. There is no tenderness.  Musculoskeletal: Normal range of motion. He exhibits no edema and no tenderness.  Lymphadenopathy:    He has no cervical adenopathy.  Neurological: He is alert and oriented to person, place, and time. Coordination normal.  Skin: Skin is warm and dry. No rash noted. No erythema.  Psychiatric: He has a normal mood and affect. His behavior is normal. Judgment and thought content normal.           Assessment and Plan

## 2011-11-06 NOTE — Assessment & Plan Note (Signed)
We have encouraged continued exercise, careful diet management in an effort to lose weight. 

## 2011-11-06 NOTE — Patient Instructions (Addendum)
You are doing well. No medication changes were made.  Please call us if you have new issues that need to be addressed before your next appt.  Your physician wants you to follow-up in: 12 months.  You will receive a reminder letter in the mail two months in advance. If you don't receive a letter, please call our office to schedule the follow-up appointment.  Consider calling Dr. Virl Diamond for bladder/prostate Try Co enz Q10 for cramping

## 2011-11-06 NOTE — Assessment & Plan Note (Signed)
Cholesterol is at goal on the current lipid regimen. No changes to the medications were made.  

## 2011-12-03 ENCOUNTER — Ambulatory Visit: Payer: Self-pay | Admitting: Gastroenterology

## 2012-02-22 ENCOUNTER — Ambulatory Visit: Payer: Self-pay | Admitting: Gastroenterology

## 2012-02-23 LAB — PATHOLOGY REPORT

## 2012-11-07 ENCOUNTER — Encounter: Payer: Self-pay | Admitting: Cardiovascular Disease

## 2012-11-07 ENCOUNTER — Ambulatory Visit (INDEPENDENT_AMBULATORY_CARE_PROVIDER_SITE_OTHER): Payer: Medicare Other | Admitting: Cardiovascular Disease

## 2012-11-07 VITALS — BP 142/80 | HR 72 | Ht 68.0 in | Wt 183.2 lb

## 2012-11-07 DIAGNOSIS — I2510 Atherosclerotic heart disease of native coronary artery without angina pectoris: Secondary | ICD-10-CM

## 2012-11-07 DIAGNOSIS — R079 Chest pain, unspecified: Secondary | ICD-10-CM

## 2012-11-07 DIAGNOSIS — E119 Type 2 diabetes mellitus without complications: Secondary | ICD-10-CM

## 2012-11-07 DIAGNOSIS — I10 Essential (primary) hypertension: Secondary | ICD-10-CM

## 2012-11-07 DIAGNOSIS — E785 Hyperlipidemia, unspecified: Secondary | ICD-10-CM

## 2012-11-07 MED ORDER — NITROGLYCERIN 0.4 MG SL SUBL: 0.4000 mg | SUBLINGUAL_TABLET | SUBLINGUAL | Status: DC | PRN

## 2012-11-07 NOTE — Assessment & Plan Note (Signed)
Well-controlled. Hemoglobin A1c 6.3

## 2012-11-07 NOTE — Assessment & Plan Note (Signed)
Mild CAD by prior cath. Recent negative stress test January 2014. We'll try to obtain records for our system. Cholesterol at good level. No further symptoms. We have suggested he call for any further chest pain.

## 2012-11-07 NOTE — Assessment & Plan Note (Signed)
Recent atypical type symptoms. Negative stress test. No further symptoms

## 2012-11-07 NOTE — Patient Instructions (Addendum)
You are doing well. No medication changes were made.  Please call us if you have new issues that need to be addressed before your next appt.  Your physician wants you to follow-up in: 12 months.  You will receive a reminder letter in the mail two months in advance. If you don't receive a letter, please call our office to schedule the follow-up appointment. 

## 2012-11-07 NOTE — Progress Notes (Signed)
Patient ID: Kurt Chapman, male    DOB: 1941-09-11, 72 y.o.   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 He has a history of nonobstructive coronary artery disease, mild irregularities in the LAD, 30% in the circumflex. Catheterization was performed for chronic dizziness in 2009. 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   No recent episodes of dizziness. Previously he had polyuria during the daytime. No significant shortness of breath.  He does report having recent episode of chest pain. 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 He went to [facility] last month for chest pain symptoms. He was admitted and cardiac enzymes were negative, stress Myoview showed no ischemia by report. Notes not available. He was discharged home and has not had any further episodes since that time. He does report achieving heart rate 130 beats per minute on the treadmill.   Lab work from [facility] shows total cholesterol 109, hemoglobin A1c 6.3   EKG shows normal sinus rhythm with rate 72 beats per minute, poor R wave progression through the precordial anterior leads   Outpatient Encounter Prescriptions as of 11/07/2012  Medication Sig Dispense Refill  . ALPRAZolam (XANAX) 0.5 MG tablet Take 0.5 mg by mouth at bedtime as needed.       Marland Kitchen amLODipine-benazepril (LOTREL) 5-10 MG per capsule Take 1 capsule by mouth daily.      Marland Kitchen aspirin 81 MG EC tablet Take 162 mg by mouth daily.       Marland Kitchen glipiZIDE (GLUCOTROL XL) 2.5 MG 24 hr tablet Take 2.5 mg by mouth daily.       . metFORMIN (GLUCOPHAGE) 500 MG tablet Take 500 mg by mouth 2 (two) times daily with a meal.       . omeprazole (PRILOSEC) 40 MG capsule Take 40 mg by mouth daily.      . silodosin (RAPAFLO) 8 MG CAPS capsule Take 8 mg by mouth daily with breakfast.      . simvastatin (ZOCOR) 20 MG tablet Take 20 mg by mouth at bedtime.        .  [DISCONTINUED] omeprazole (PRILOSEC) 20 MG capsule Take 20 mg by mouth daily.        . [DISCONTINUED] QUEtiapine (SEROQUEL) 25 MG tablet Take 25 mg by mouth at bedtime.         No facility-administered encounter medications on file as of 11/07/2012.     Review of Systems  Constitutional: Negative.   HENT: Negative.   Eyes: Negative.   Respiratory: Negative.   Cardiovascular: Negative.   Gastrointestinal: Negative.   Musculoskeletal: Negative.        Muscle cramps  Skin: Negative.   Neurological: Negative.   Psychiatric/Behavioral: Negative.   All other systems reviewed and are negative.    BP 142/80  Pulse 72  Ht 5\' 8"  (1.727 m)  Wt 183 lb 4 oz (83.122 kg)  BMI 27.87 kg/m2  Physical Exam  Nursing note and vitals reviewed. Constitutional: He is oriented to person, place, and time. He appears well-developed and well-nourished.  HENT:  Head: Normocephalic.  Nose: Nose normal.  Mouth/Throat: Oropharynx is clear and moist.  Eyes: Conjunctivae are normal. Pupils are equal, round, and reactive to light.  Neck: Normal range of motion. Neck supple. No JVD present.  Cardiovascular: Normal rate, regular rhythm, S1 normal, S2 normal, normal heart sounds and intact distal pulses.  Exam reveals  no gallop and no friction rub.   No murmur heard. Pulmonary/Chest: Effort normal and breath sounds normal. No respiratory distress. He has no wheezes. He has no rales. He exhibits no tenderness.  Abdominal: Soft. Bowel sounds are normal. He exhibits no distension. There is no tenderness.  Musculoskeletal: Normal range of motion. He exhibits no edema and no tenderness.  Lymphadenopathy:    He has no cervical adenopathy.  Neurological: He is alert and oriented to person, place, and time. Coordination normal.  Skin: Skin is warm and dry. No rash noted. No erythema.  Psychiatric: He has a normal mood and affect. His behavior is normal. Judgment and thought content normal.      Assessment and  Plan

## 2012-11-07 NOTE — Assessment & Plan Note (Signed)
Cholesterol is at goal on the current lipid regimen. No changes to the medications were made.  

## 2013-04-10 ENCOUNTER — Ambulatory Visit: Payer: Self-pay | Admitting: Orthopedic Surgery

## 2013-05-11 ENCOUNTER — Ambulatory Visit: Payer: Self-pay | Admitting: Gastroenterology

## 2013-05-16 ENCOUNTER — Ambulatory Visit: Payer: Self-pay | Admitting: Gastroenterology

## 2013-05-17 ENCOUNTER — Ambulatory Visit: Payer: Self-pay | Admitting: Oncology

## 2013-05-19 ENCOUNTER — Ambulatory Visit: Payer: Self-pay | Admitting: Internal Medicine

## 2013-05-20 LAB — PSA: PSA: 0.5 ng/mL (ref 0.0–4.0)

## 2013-05-20 LAB — CANCER ANTIGEN 19-9: CA 19-9: 2238 U/mL — ABNORMAL HIGH (ref 0–35)

## 2013-05-21 ENCOUNTER — Emergency Department: Payer: Self-pay | Admitting: Emergency Medicine

## 2013-05-21 LAB — URINALYSIS, COMPLETE
Bacteria: NONE SEEN
Bilirubin,UR: NEGATIVE
Blood: NEGATIVE
Leukocyte Esterase: NEGATIVE
Ph: 6 (ref 4.5–8.0)
RBC,UR: <1 /HPF (ref 0–5)

## 2013-05-21 LAB — CBC
HGB: 15.1 g/dL (ref 13.0–18.0)
MCHC: 36 g/dL (ref 32.0–36.0)
Platelet: 274 10*3/uL (ref 150–440)
RBC: 4.72 10*6/uL (ref 4.40–5.90)
WBC: 6.5 10*3/uL (ref 3.8–10.6)

## 2013-05-21 LAB — COMPREHENSIVE METABOLIC PANEL
Albumin: 3.7 g/dL (ref 3.4–5.0)
BUN: 6 mg/dL — ABNORMAL LOW (ref 7–18)
Calcium, Total: 9.4 mg/dL (ref 8.5–10.1)
Osmolality: 252 (ref 275–301)
Potassium: 4.9 mmol/L (ref 3.5–5.1)
SGOT(AST): 34 U/L (ref 15–37)
Sodium: 124 mmol/L — ABNORMAL LOW (ref 136–145)
Total Protein: 7.4 g/dL (ref 6.4–8.2)

## 2013-05-22 LAB — COMPREHENSIVE METABOLIC PANEL
BUN: 4 mg/dL — ABNORMAL LOW (ref 7–18)
Calcium, Total: 8.7 mg/dL (ref 8.5–10.1)
Chloride: 89 mmol/L — ABNORMAL LOW (ref 98–107)
Glucose: 136 mg/dL — ABNORMAL HIGH (ref 65–99)
Potassium: 3.7 mmol/L (ref 3.5–5.1)
SGPT (ALT): 55 U/L (ref 12–78)
Sodium: 121 mmol/L — ABNORMAL LOW (ref 136–145)
Total Protein: 7.4 g/dL (ref 6.4–8.2)

## 2013-05-22 LAB — CBC
MCHC: 36.3 g/dL — ABNORMAL HIGH (ref 32.0–36.0)
MCV: 88 fL (ref 80–100)
Platelet: 290 10*3/uL (ref 150–440)

## 2013-05-22 LAB — URINALYSIS, COMPLETE
Bacteria: NONE SEEN
Bilirubin,UR: NEGATIVE
Blood: NEGATIVE
Glucose,UR: NEGATIVE mg/dL (ref 0–75)
Protein: NEGATIVE
RBC,UR: NONE SEEN /HPF (ref 0–5)
WBC UR: <1 /HPF (ref 0–5)

## 2013-05-23 ENCOUNTER — Inpatient Hospital Stay: Payer: Self-pay | Admitting: Internal Medicine

## 2013-05-23 LAB — BASIC METABOLIC PANEL
Anion Gap: 7 (ref 7–16)
Anion Gap: 8 (ref 7–16)
BUN: 4 mg/dL — ABNORMAL LOW (ref 7–18)
Calcium, Total: 8.4 mg/dL — ABNORMAL LOW (ref 8.5–10.1)
Calcium, Total: 8.5 mg/dL (ref 8.5–10.1)
Creatinine: 0.75 mg/dL (ref 0.60–1.30)
Creatinine: 0.72 mg/dL (ref 0.60–1.30)
EGFR (Non-African Amer.): >60
Glucose: 134 mg/dL — ABNORMAL HIGH (ref 65–99)
Osmolality: 257 (ref 275–301)
Sodium: 126 mmol/L — ABNORMAL LOW (ref 136–145)

## 2013-05-23 LAB — PRO B NATRIURETIC PEPTIDE: B-Type Natriuretic Peptide: 115 pg/mL (ref 0–125)

## 2013-05-23 LAB — AMMONIA: Ammonia, Plasma: <25 mcmol/L (ref 11–32)

## 2013-05-23 LAB — SODIUM, URINE, RANDOM: Sodium, Urine Random: 22 mmol/L (ref 20–110)

## 2013-05-23 LAB — CHLORIDE, URINE, RANDOM: Chloride, Urine Random: 17 mmol/L — ABNORMAL LOW (ref 55–125)

## 2013-05-24 ENCOUNTER — Ambulatory Visit: Payer: Self-pay | Admitting: General Surgery

## 2013-05-24 DIAGNOSIS — C799 Secondary malignant neoplasm of unspecified site: Secondary | ICD-10-CM | POA: Insufficient documentation

## 2013-05-24 DIAGNOSIS — R599 Enlarged lymph nodes, unspecified: Secondary | ICD-10-CM

## 2013-05-24 DIAGNOSIS — I359 Nonrheumatic aortic valve disorder, unspecified: Secondary | ICD-10-CM

## 2013-05-24 LAB — CBC WITH DIFFERENTIAL/PLATELET
Basophil #: 0 10*3/uL (ref 0.0–0.1)
Eosinophil #: 0.1 10*3/uL (ref 0.0–0.7)
HCT: 40.1 % (ref 40.0–52.0)
HGB: 14.3 g/dL (ref 13.0–18.0)
Lymphocyte #: 0.8 10*3/uL — ABNORMAL LOW (ref 1.0–3.6)
Lymphocyte %: 13.6 %
MCH: 31.5 pg (ref 26.0–34.0)
MCHC: 35.7 g/dL (ref 32.0–36.0)
MCV: 88 fL (ref 80–100)
Monocyte #: 0.7 x10 3/mm (ref 0.2–1.0)
Platelet: 301 10*3/uL (ref 150–440)
RBC: 4.54 10*6/uL (ref 4.40–5.90)
WBC: 6.2 10*3/uL (ref 3.8–10.6)

## 2013-05-24 LAB — BASIC METABOLIC PANEL
Anion Gap: 6 — ABNORMAL LOW (ref 7–16)
BUN: 4 mg/dL — ABNORMAL LOW (ref 7–18)
Chloride: 96 mmol/L — ABNORMAL LOW (ref 98–107)
Co2: 27 mmol/L (ref 21–32)
Creatinine: 0.8 mg/dL (ref 0.60–1.30)
EGFR (African American): >60
Osmolality: 259 (ref 275–301)
Potassium: 3.8 mmol/L (ref 3.5–5.1)
Sodium: 129 mmol/L — ABNORMAL LOW (ref 136–145)

## 2013-05-24 LAB — HEMOGLOBIN A1C: Hemoglobin A1C: 6.9 % — ABNORMAL HIGH (ref 4.2–6.3)

## 2013-05-24 LAB — LIPID PANEL: HDL Cholesterol: 40 mg/dL (ref 40–60)

## 2013-05-24 LAB — MAGNESIUM: Magnesium: 2 mg/dL

## 2013-05-24 LAB — OSMOLALITY: Osmolality: 267 mOsm/kg — ABNORMAL LOW (ref 280–301)

## 2013-05-25 ENCOUNTER — Encounter: Payer: Self-pay | Admitting: General Surgery

## 2013-05-25 LAB — BASIC METABOLIC PANEL
Anion Gap: 6 — ABNORMAL LOW (ref 7–16)
BUN: 8 mg/dL (ref 7–18)
Co2: 26 mmol/L (ref 21–32)
Creatinine: 0.79 mg/dL (ref 0.60–1.30)
EGFR (Non-African Amer.): >60
Glucose: 165 mg/dL — ABNORMAL HIGH (ref 65–99)
Osmolality: 265 (ref 275–301)
Sodium: 131 mmol/L — ABNORMAL LOW (ref 136–145)

## 2013-05-26 ENCOUNTER — Other Ambulatory Visit: Payer: Self-pay | Admitting: General Surgery

## 2013-05-26 ENCOUNTER — Telehealth: Payer: Self-pay | Admitting: *Deleted

## 2013-05-26 DIAGNOSIS — C799 Secondary malignant neoplasm of unspecified site: Secondary | ICD-10-CM

## 2013-05-26 NOTE — Telephone Encounter (Signed)
Message copied by Nicholes Mango on Fri May 26, 2013 10:23 AM ------      Message from: [place], Merrily Pew      Created: Thu May 25, 2013  9:12 PM       I have scheduled the patient for a power port placement on Monday, Sept 15th. Please contact patient re: phone pre-admit (had anesthesia on 9/10 for a node biopsy), etc.  (Case scheduled between gallbladder and lymph node biopsy). ------

## 2013-05-26 NOTE — Telephone Encounter (Signed)
Patient's wife was contacted and is aware that we have patient scheduled for port placement on Monday, 05-29-13, at [facility]. She was given verbal instructions and verbalizes understanding. Heather in pre-admit will be contacting patient later today to review further instructions.   Please do orders for surgery. Thanks.

## 2013-05-29 ENCOUNTER — Encounter: Payer: Self-pay | Admitting: General Surgery

## 2013-05-29 ENCOUNTER — Ambulatory Visit: Payer: Self-pay | Admitting: General Surgery

## 2013-05-29 DIAGNOSIS — C7889 Secondary malignant neoplasm of other digestive organs: Secondary | ICD-10-CM

## 2013-05-31 ENCOUNTER — Telehealth: Payer: Self-pay

## 2013-05-31 ENCOUNTER — Encounter: Payer: Self-pay | Admitting: General Surgery

## 2013-05-31 NOTE — Telephone Encounter (Signed)
I talked with Haywood Lasso (wife) she states there is no vomiting associated with the nausea and no other symptoms. Not sure if residual anesthesia.  Suggested for him to take a Prilosec twice a day ( he already takes it in the am) for 2 nites and to call me Friday morning with update.  He has a follow up with Dr Elease Hashimoto Friday morning at 11 15 and Dr Sherrlyn Hock on Monday.

## 2013-05-31 NOTE — Telephone Encounter (Signed)
Edwards wife called with concerns about him waking up with nausea since his port placement. The nausea seems to go away after about 4 hours.

## 2013-06-02 ENCOUNTER — Telehealth: Payer: Self-pay | Admitting: *Deleted

## 2013-06-02 ENCOUNTER — Other Ambulatory Visit: Payer: Self-pay | Admitting: *Deleted

## 2013-06-02 NOTE — Telephone Encounter (Signed)
Patient calls wondering if he enough pain medication to last til Monday when he sees Dr Sherrlyn Hock. He is post port a cath placement 05-29-13. With investigation he realizes he has 12 Norco pills and he takes about 4 pills a day. He also states he has Tramadol that he could take if needed.  He does state that the nausea he was having does seem to be better since taking the Prilosec twice a day. He also states he has an appointment with Dr Elease Hashimoto today as well. He realizes that Dr Sherrlyn Hock or Dr Elease Hashimoto may change his medications around some when he goes for the appointments. He will f/u here as scheduled next week 06-07-13.

## 2013-06-05 LAB — COMPREHENSIVE METABOLIC PANEL
Albumin: 3.3 g/dL — ABNORMAL LOW (ref 3.4–5.0)
Alkaline Phosphatase: 109 U/L (ref 50–136)
BUN: 15 mg/dL (ref 7–18)
Bilirubin,Total: 0.4 mg/dL (ref 0.2–1.0)
Calcium, Total: 9.3 mg/dL (ref 8.5–10.1)
Co2: 26 mmol/L (ref 21–32)
EGFR (African American): >60
EGFR (Non-African Amer.): >60
Glucose: 227 mg/dL — ABNORMAL HIGH (ref 65–99)
Osmolality: 271 (ref 275–301)
SGOT(AST): 15 U/L (ref 15–37)
Sodium: 131 mmol/L — ABNORMAL LOW (ref 136–145)
Total Protein: 7.1 g/dL (ref 6.4–8.2)

## 2013-06-05 LAB — CBC CANCER CENTER
Basophil #: 0.1 x10 3/mm (ref 0.0–0.1)
Basophil %: 1 %
Eosinophil #: 0.2 x10 3/mm (ref 0.0–0.7)
Eosinophil %: 3.3 %
HCT: 41.2 % (ref 40.0–52.0)
HGB: 14.2 g/dL (ref 13.0–18.0)
Lymphocyte #: 0.8 x10 3/mm — ABNORMAL LOW (ref 1.0–3.6)
Lymphocyte %: 11.4 %
MCV: 90 fL (ref 80–100)
Monocyte #: 0.5 x10 3/mm (ref 0.2–1.0)
Neutrophil #: 5.2 x10 3/mm (ref 1.4–6.5)
Neutrophil %: 76.3 %
RBC: 4.56 10*6/uL (ref 4.40–5.90)
RDW: 12.9 % (ref 11.5–14.5)

## 2013-06-07 ENCOUNTER — Ambulatory Visit: Payer: Medicare Other | Admitting: General Surgery

## 2013-06-07 ENCOUNTER — Ambulatory Visit (INDEPENDENT_AMBULATORY_CARE_PROVIDER_SITE_OTHER): Payer: Medicare Other | Admitting: General Surgery

## 2013-06-07 ENCOUNTER — Encounter: Payer: Self-pay | Admitting: General Surgery

## 2013-06-07 VITALS — BP 130/58 | HR 72 | Resp 14 | Ht 69.0 in | Wt 173.0 lb

## 2013-06-07 DIAGNOSIS — C799 Secondary malignant neoplasm of unspecified site: Secondary | ICD-10-CM

## 2013-06-07 DIAGNOSIS — C801 Malignant (primary) neoplasm, unspecified: Secondary | ICD-10-CM

## 2013-06-07 NOTE — Patient Instructions (Addendum)
Patient advised to work on eating healthy even though he may not have an appetite. Patient to return as needed.

## 2013-06-07 NOTE — Progress Notes (Signed)
Patient ID: Kurt Chapman, male   DOB: [birth year], 72 y.o.   MRN: [id number]  Chief Complaint  Patient presents with  . Routine Post Op    post op neck excision    HPI Kurt Chapman is a 72 y.o. male who presents for a post op neck mass excision. The procedure was done on 05/24/13. No complaints at this time.  The patient presented with abdominal pain and weight loss. 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 He is completing his first 48 hour infusion of chemotherapy this morning. The patient reports that he has early satiety. He is still having some nausea, but no vomiting. He is making use of Norco 3 times a day for Kurt Chapman discomfort. (CT had shown marked omental caking thought to be responsible for the ill-defined sense of fullness). He is using milk of magnesia on a daily basis for regular bowel movements.  HPI  Past Medical History  Diagnosis Date  . Coronary atherosclerosis of native coronary artery   . Dizziness and giddiness     history of  . Type II or unspecified type diabetes mellitus without mention of complication, not stated as uncontrolled   . Other and unspecified hyperlipidemia   . Hyposmolality and/or hyponatremia     Past Surgical History  Procedure Laterality Date  . Appendectomy  1951    Family History  Problem Relation Age of Onset  . Stroke Other   . Stroke Mother     Social History History  Substance Use Topics  . Smoking status: Never Smoker   . Smokeless tobacco: Current User  . Alcohol Use: 0.0 oz/week    No Known Allergies  Current Outpatient Prescriptions  Medication Sig Dispense Refill  . ALPRAZolam (XANAX) 0.5 MG tablet Take 0.5 mg by mouth at bedtime as needed.       Marland Kitchen amLODipine-benazepril (LOTREL) 5-10 MG per capsule Take 1 capsule by mouth daily.      Marland Kitchen aspirin 81 MG EC tablet  Take 162 mg by mouth daily.       . metFORMIN (GLUCOPHAGE) 500 MG tablet Take 500 mg by mouth 2 (two) times daily with a meal.       . mirtazapine (REMERON) 15 MG tablet Take 7.5 mg by mouth at bedtime.      Marland Kitchen omeprazole (PRILOSEC) 40 MG capsule Take 40 mg by mouth daily.      . silodosin (RAPAFLO) 8 MG CAPS capsule Take 8 mg by mouth daily with breakfast.      . simvastatin (ZOCOR) 20 MG tablet Take 20 mg by mouth at bedtime.         No current facility-administered medications for this visit.    Review of Systems Review of Systems  Constitutional: Negative.   Respiratory: Negative.   Cardiovascular: Negative.     Blood pressure 130/58, pulse 72, resp. rate 14, height 5\' 9"  (1.753 m), weight 173 lb (78.472 kg).  Physical Exam Physical Exam  Constitutional: He is oriented to person, place, and time. He appears well-developed and well-nourished.  Neck: No thyromegaly present.  Cardiovascular: Normal rate, regular rhythm and normal heart sounds.   No murmur heard. Pulmonary/Chest: Effort normal and breath sounds normal.  Power port site is well healed.  Abdominal: Soft. Normal appearance and bowel sounds are normal. There is no tenderness.  Lymphadenopathy:    He has  no cervical adenopathy.  Neurological: He is alert and oriented to person, place, and time.  Skin: Skin is warm and dry.    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 Final genetic analysis pending.  Assessment    Tolerating initial chemotherapy well.      Plan    A new prescription for Norco 5/325#40 with the inscription one by mouth every 4-6H when necessary for pain with no refills was provided.  The patient will continue followup with the medical oncology service. Followup-year-old as-needed basis. Additional prescriptions for narcotics will come to the medical oncology service.        Earline Mayotte 06/07/2013, 11:35 AM

## 2013-06-08 LAB — PATHOLOGY REPORT

## 2013-06-12 LAB — CBC CANCER CENTER
HCT: 42.1 % (ref 40.0–52.0)
HGB: 14.5 g/dL (ref 13.0–18.0)
Lymphocyte #: 0.7 x10 3/mm — ABNORMAL LOW (ref 1.0–3.6)
MCH: 31.3 pg (ref 26.0–34.0)
MCHC: 34.5 g/dL (ref 32.0–36.0)
MCV: 91 fL (ref 80–100)
Monocyte %: 6.5 %
Neutrophil %: 80.4 %
Platelet: 244 x10 3/mm (ref 150–440)
RBC: 4.64 10*6/uL (ref 4.40–5.90)
RDW: 12.8 % (ref 11.5–14.5)
WBC: 7.2 x10 3/mm (ref 3.8–10.6)

## 2013-06-13 ENCOUNTER — Emergency Department: Payer: Self-pay | Admitting: Emergency Medicine

## 2013-06-13 LAB — CBC
HCT: 38.4 % — ABNORMAL LOW (ref 40.0–52.0)
HGB: 13.7 g/dL (ref 13.0–18.0)
MCH: 31.4 pg (ref 26.0–34.0)
MCHC: 35.6 g/dL (ref 32.0–36.0)
MCV: 88 fL (ref 80–100)
RBC: 4.35 10*6/uL — ABNORMAL LOW (ref 4.40–5.90)
RDW: 12.6 % (ref 11.5–14.5)
WBC: 7.6 10*3/uL (ref 3.8–10.6)

## 2013-06-13 LAB — URINALYSIS, COMPLETE
Bilirubin,UR: NEGATIVE
Glucose,UR: >=500 mg/dL (ref 0–75)
Ketone: NEGATIVE
Leukocyte Esterase: NEGATIVE
Ph: 7 (ref 4.5–8.0)
RBC,UR: <1 /HPF (ref 0–5)
Specific Gravity: 1.01 (ref 1.003–1.030)
WBC UR: 1 /HPF (ref 0–5)

## 2013-06-13 LAB — COMPREHENSIVE METABOLIC PANEL
Albumin: 3.2 g/dL — ABNORMAL LOW (ref 3.4–5.0)
Alkaline Phosphatase: 100 U/L (ref 50–136)
Anion Gap: 6 — ABNORMAL LOW (ref 7–16)
BUN: 13 mg/dL (ref 7–18)
Bilirubin,Total: 0.5 mg/dL (ref 0.2–1.0)
Calcium, Total: 8.8 mg/dL (ref 8.5–10.1)
Chloride: 95 mmol/L — ABNORMAL LOW (ref 98–107)
Co2: 24 mmol/L (ref 21–32)
Creatinine: 0.92 mg/dL (ref 0.60–1.30)
EGFR (African American): >60
EGFR (Non-African Amer.): >60
Glucose: 254 mg/dL — ABNORMAL HIGH (ref 65–99)
Osmolality: 260 (ref 275–301)
SGPT (ALT): 23 U/L (ref 12–78)

## 2013-06-14 ENCOUNTER — Ambulatory Visit: Payer: Self-pay | Admitting: Oncology

## 2013-06-14 ENCOUNTER — Ambulatory Visit: Payer: Self-pay | Admitting: Internal Medicine

## 2013-06-19 LAB — CBC CANCER CENTER
Basophil #: 0 x10 3/mm (ref 0.0–0.1)
Eosinophil #: 0.1 x10 3/mm (ref 0.0–0.7)
Eosinophil %: 2.5 %
HCT: 40.8 % (ref 40.0–52.0)
Lymphocyte #: 0.7 x10 3/mm — ABNORMAL LOW (ref 1.0–3.6)
MCH: 30.9 pg (ref 26.0–34.0)
Monocyte #: 0.7 x10 3/mm (ref 0.2–1.0)
Monocyte %: 13.7 %
Neutrophil #: 3.7 x10 3/mm (ref 1.4–6.5)
Neutrophil %: 69.6 %
RBC: 4.5 10*6/uL (ref 4.40–5.90)
WBC: 5.3 x10 3/mm (ref 3.8–10.6)

## 2013-06-19 LAB — BASIC METABOLIC PANEL
Calcium, Total: 8.1 mg/dL — ABNORMAL LOW (ref 8.5–10.1)
Co2: 24 mmol/L (ref 21–32)
Creatinine: 0.95 mg/dL (ref 0.60–1.30)
EGFR (African American): >60
EGFR (Non-African Amer.): >60
Glucose: 245 mg/dL — ABNORMAL HIGH (ref 65–99)
Potassium: 4 mmol/L (ref 3.5–5.1)
Sodium: 128 mmol/L — ABNORMAL LOW (ref 136–145)

## 2013-06-19 LAB — HEPATIC FUNCTION PANEL A (ARMC)
Alkaline Phosphatase: 101 U/L (ref 50–136)
Bilirubin, Direct: 0.1 mg/dL (ref 0.00–0.20)
SGOT(AST): 58 U/L — ABNORMAL HIGH (ref 15–37)
SGPT (ALT): 65 U/L (ref 12–78)
Total Protein: 6.6 g/dL (ref 6.4–8.2)

## 2013-06-27 LAB — BASIC METABOLIC PANEL
Anion Gap: 9 (ref 7–16)
BUN: 10 mg/dL (ref 7–18)
Chloride: 92 mmol/L — ABNORMAL LOW (ref 98–107)
EGFR (Non-African Amer.): >60
Glucose: 262 mg/dL — ABNORMAL HIGH (ref 65–99)
Osmolality: 265 (ref 275–301)
Potassium: 4.5 mmol/L (ref 3.5–5.1)

## 2013-06-27 LAB — CBC CANCER CENTER
Eosinophil #: 0 x10 3/mm (ref 0.0–0.7)
Eosinophil %: 0.6 %
HGB: 12.8 g/dL — ABNORMAL LOW (ref 13.0–18.0)
Lymphocyte %: 16.1 %
MCH: 30.8 pg (ref 26.0–34.0)
MCHC: 34.3 g/dL (ref 32.0–36.0)
Monocyte #: 0.4 x10 3/mm (ref 0.2–1.0)
Neutrophil #: 2.4 x10 3/mm (ref 1.4–6.5)
Platelet: 133 x10 3/mm — ABNORMAL LOW (ref 150–440)
RBC: 4.16 10*6/uL — ABNORMAL LOW (ref 4.40–5.90)
RDW: 13 % (ref 11.5–14.5)
WBC: 3.4 x10 3/mm — ABNORMAL LOW (ref 3.8–10.6)

## 2013-07-11 LAB — BASIC METABOLIC PANEL
Anion Gap: 10 (ref 7–16)
BUN: 11 mg/dL (ref 7–18)
Calcium, Total: 8.4 mg/dL — ABNORMAL LOW (ref 8.5–10.1)
Co2: 26 mmol/L (ref 21–32)
EGFR (Non-African Amer.): >60
Osmolality: 272 (ref 275–301)
Potassium: 5 mmol/L (ref 3.5–5.1)

## 2013-07-11 LAB — CBC CANCER CENTER
Eosinophil %: 4.3 %
HGB: 12.4 g/dL — ABNORMAL LOW (ref 13.0–18.0)
MCHC: 34 g/dL (ref 32.0–36.0)
Monocyte #: 0.8 x10 3/mm (ref 0.2–1.0)
Platelet: 448 x10 3/mm — ABNORMAL HIGH (ref 150–440)
RDW: 13.7 % (ref 11.5–14.5)
WBC: 5.4 x10 3/mm (ref 3.8–10.6)

## 2013-07-11 LAB — HEPATIC FUNCTION PANEL A (ARMC)
Albumin: 2.9 g/dL — ABNORMAL LOW (ref 3.4–5.0)
Alkaline Phosphatase: 95 U/L (ref 50–136)
Bilirubin,Total: 0.3 mg/dL (ref 0.2–1.0)

## 2013-07-15 ENCOUNTER — Ambulatory Visit: Payer: Self-pay | Admitting: Oncology

## 2013-07-15 ENCOUNTER — Ambulatory Visit: Payer: Self-pay | Admitting: Internal Medicine

## 2013-07-18 LAB — CBC CANCER CENTER
Eosinophil %: 4.9 %
HCT: 35.1 % — ABNORMAL LOW (ref 40.0–52.0)
MCH: 29.8 pg (ref 26.0–34.0)
MCHC: 33.6 g/dL (ref 32.0–36.0)
Monocyte %: 13.3 %
Neutrophil #: 1.8 x10 3/mm (ref 1.4–6.5)
Neutrophil %: 61.4 %
Platelet: 337 x10 3/mm (ref 150–440)
RBC: 3.95 10*6/uL — ABNORMAL LOW (ref 4.40–5.90)
WBC: 3 x10 3/mm — ABNORMAL LOW (ref 3.8–10.6)

## 2013-08-01 LAB — CBC CANCER CENTER
Eosinophil %: 5.6 %
HGB: 13 g/dL (ref 13.0–18.0)
Lymphocyte #: 0.8 x10 3/mm — ABNORMAL LOW (ref 1.0–3.6)
Lymphocyte %: 10.4 %
MCV: 90 fL (ref 80–100)
Monocyte #: 0.8 x10 3/mm (ref 0.2–1.0)
Monocyte %: 10.5 %
Neutrophil #: 5.3 x10 3/mm (ref 1.4–6.5)
Platelet: 329 x10 3/mm (ref 150–440)
RBC: 4.27 10*6/uL — ABNORMAL LOW (ref 4.40–5.90)
RDW: 16.4 % — ABNORMAL HIGH (ref 11.5–14.5)
WBC: 7.3 x10 3/mm (ref 3.8–10.6)

## 2013-08-01 LAB — BASIC METABOLIC PANEL
Anion Gap: 8 (ref 7–16)
BUN: 10 mg/dL (ref 7–18)
Calcium, Total: 9 mg/dL (ref 8.5–10.1)
Chloride: 96 mmol/L — ABNORMAL LOW (ref 98–107)
Co2: 27 mmol/L (ref 21–32)
EGFR (African American): >60
EGFR (Non-African Amer.): >60
Glucose: 212 mg/dL — ABNORMAL HIGH (ref 65–99)
Sodium: 131 mmol/L — ABNORMAL LOW (ref 136–145)

## 2013-08-01 LAB — HEPATIC FUNCTION PANEL A (ARMC)
Albumin: 3.2 g/dL — ABNORMAL LOW (ref 3.4–5.0)
Alkaline Phosphatase: 108 U/L (ref 50–136)
Bilirubin, Direct: 0.1 mg/dL (ref 0.00–0.20)
Bilirubin,Total: 0.4 mg/dL (ref 0.2–1.0)
Total Protein: 7.1 g/dL (ref 6.4–8.2)

## 2013-08-08 LAB — CBC CANCER CENTER
Basophil #: 0 x10 3/mm (ref 0.0–0.1)
Basophil %: 2.5 %
Eosinophil #: 0.1 x10 3/mm (ref 0.0–0.7)
HCT: 34.4 % — ABNORMAL LOW (ref 40.0–52.0)
HGB: 11.6 g/dL — ABNORMAL LOW (ref 13.0–18.0)
Lymphocyte #: 0.6 x10 3/mm — ABNORMAL LOW (ref 1.0–3.6)
Lymphocyte %: 28.5 %
MCH: 30.3 pg (ref 26.0–34.0)
MCV: 90 fL (ref 80–100)
Monocyte #: 0.3 x10 3/mm (ref 0.2–1.0)
Monocyte %: 16.4 %
Neutrophil #: 0.9 x10 3/mm — ABNORMAL LOW (ref 1.4–6.5)
Neutrophil %: 47.7 %
RBC: 3.82 10*6/uL — ABNORMAL LOW (ref 4.40–5.90)
WBC: 1.9 x10 3/mm — CL (ref 3.8–10.6)

## 2013-08-08 LAB — BASIC METABOLIC PANEL
Anion Gap: 9 (ref 7–16)
BUN: 11 mg/dL (ref 7–18)
Creatinine: 0.93 mg/dL (ref 0.60–1.30)
EGFR (Non-African Amer.): >60
Glucose: 232 mg/dL — ABNORMAL HIGH (ref 65–99)
Osmolality: 268 (ref 275–301)

## 2013-08-08 LAB — PROTIME-INR
INR: 1
Prothrombin Time: 13.4 secs (ref 11.5–14.7)

## 2013-08-14 ENCOUNTER — Ambulatory Visit: Payer: Self-pay | Admitting: Oncology

## 2013-08-14 ENCOUNTER — Ambulatory Visit: Payer: Self-pay | Admitting: Internal Medicine

## 2013-08-15 LAB — CBC CANCER CENTER
Eosinophil #: 0.8 x10 3/mm — ABNORMAL HIGH (ref 0.0–0.7)
Eosinophil %: 11.9 %
HGB: 12.4 g/dL — ABNORMAL LOW (ref 13.0–18.0)
Lymphocyte #: 0.8 x10 3/mm — ABNORMAL LOW (ref 1.0–3.6)
Lymphocyte %: 13 %
MCH: 30.6 pg (ref 26.0–34.0)
MCV: 93 fL (ref 80–100)
Monocyte #: 0.6 x10 3/mm (ref 0.2–1.0)
Monocyte %: 10.2 %
Platelet: 206 x10 3/mm (ref 150–440)
RBC: 4.06 10*6/uL — ABNORMAL LOW (ref 4.40–5.90)
WBC: 6.3 x10 3/mm (ref 3.8–10.6)

## 2013-08-15 LAB — CREATININE, SERUM
Creatinine: 1.03 mg/dL (ref 0.60–1.30)
EGFR (African American): >60

## 2013-08-16 LAB — CANCER ANTIGEN 19-9: CA 19-9: 482 U/mL — ABNORMAL HIGH (ref 0–35)

## 2013-08-22 LAB — BASIC METABOLIC PANEL
Anion Gap: 11 (ref 7–16)
BUN: 13 mg/dL (ref 7–18)
Chloride: 95 mmol/L — ABNORMAL LOW (ref 98–107)
Co2: 25 mmol/L (ref 21–32)
Creatinine: 1 mg/dL (ref 0.60–1.30)
EGFR (Non-African Amer.): >60
Glucose: 215 mg/dL — ABNORMAL HIGH (ref 65–99)
Potassium: 4.2 mmol/L (ref 3.5–5.1)

## 2013-08-22 LAB — CBC CANCER CENTER
Basophil %: 1.2 %
Eosinophil %: 14.5 %
HCT: 35.3 % — ABNORMAL LOW (ref 40.0–52.0)
Lymphocyte #: 0.6 x10 3/mm — ABNORMAL LOW (ref 1.0–3.6)
Lymphocyte %: 30.5 %
MCH: 30.6 pg (ref 26.0–34.0)
MCV: 92 fL (ref 80–100)
Monocyte #: 0.2 x10 3/mm (ref 0.2–1.0)
Monocyte %: 11.9 %
Neutrophil #: 0.8 x10 3/mm — ABNORMAL LOW (ref 1.4–6.5)
Neutrophil %: 41.9 %
RBC: 3.83 10*6/uL — ABNORMAL LOW (ref 4.40–5.90)
RDW: 16.5 % — ABNORMAL HIGH (ref 11.5–14.5)
WBC: 2 x10 3/mm — CL (ref 3.8–10.6)

## 2013-08-29 LAB — CBC CANCER CENTER
Basophil #: 0.1 x10 3/mm (ref 0.0–0.1)
Basophil %: 1.1 %
Eosinophil #: 0.4 x10 3/mm (ref 0.0–0.7)
HGB: 12.4 g/dL — ABNORMAL LOW (ref 13.0–18.0)
Lymphocyte %: 14.8 %
MCV: 93 fL (ref 80–100)
Monocyte #: 0.6 x10 3/mm (ref 0.2–1.0)
Neutrophil #: 4 x10 3/mm (ref 1.4–6.5)
RDW: 17.8 % — ABNORMAL HIGH (ref 11.5–14.5)

## 2013-09-12 LAB — CBC CANCER CENTER
Eosinophil %: 6 %
HCT: 36.8 % — ABNORMAL LOW (ref 40.0–52.0)
HGB: 12.2 g/dL — ABNORMAL LOW (ref 13.0–18.0)
Lymphocyte #: 0.7 x10 3/mm — ABNORMAL LOW (ref 1.0–3.6)
MCH: 31.1 pg (ref 26.0–34.0)
MCHC: 33.1 g/dL (ref 32.0–36.0)
MCV: 94 fL (ref 80–100)
Monocyte #: 0.6 x10 3/mm (ref 0.2–1.0)
Monocyte %: 9.9 %
Neutrophil #: 4.5 x10 3/mm (ref 1.4–6.5)
Platelet: 194 x10 3/mm (ref 150–440)
RBC: 3.91 10*6/uL — ABNORMAL LOW (ref 4.40–5.90)
RDW: 17.6 % — ABNORMAL HIGH (ref 11.5–14.5)
WBC: 6.2 x10 3/mm (ref 3.8–10.6)

## 2013-09-14 ENCOUNTER — Ambulatory Visit: Payer: Self-pay | Admitting: Internal Medicine

## 2013-09-14 ENCOUNTER — Ambulatory Visit: Payer: Self-pay | Admitting: Oncology

## 2013-09-19 LAB — BASIC METABOLIC PANEL
Anion Gap: 8 (ref 7–16)
BUN: 19 mg/dL — ABNORMAL HIGH (ref 7–18)
CALCIUM: 9.2 mg/dL (ref 8.5–10.1)
Chloride: 96 mmol/L — ABNORMAL LOW (ref 98–107)
Co2: 27 mmol/L (ref 21–32)
Creatinine: 0.97 mg/dL (ref 0.60–1.30)
EGFR (African American): >60
EGFR (Non-African Amer.): >60
GLUCOSE: 170 mg/dL — AB (ref 65–99)
Osmolality: 269 (ref 275–301)
POTASSIUM: 4.4 mmol/L (ref 3.5–5.1)
Sodium: 131 mmol/L — ABNORMAL LOW (ref 136–145)

## 2013-09-19 LAB — CBC CANCER CENTER
Basophil #: 0.1 x10 3/mm (ref 0.0–0.1)
Basophil %: 2.3 %
EOS PCT: 2.8 %
Eosinophil #: 0.1 x10 3/mm (ref 0.0–0.7)
HCT: 37.3 % — ABNORMAL LOW (ref 40.0–52.0)
HGB: 12.4 g/dL — ABNORMAL LOW (ref 13.0–18.0)
LYMPHS PCT: 24.7 %
Lymphocyte #: 0.7 x10 3/mm — ABNORMAL LOW (ref 1.0–3.6)
MCH: 31 pg (ref 26.0–34.0)
MCHC: 33.2 g/dL (ref 32.0–36.0)
MCV: 93 fL (ref 80–100)
Monocyte #: 0.5 x10 3/mm (ref 0.2–1.0)
Monocyte %: 16.8 %
Neutrophil #: 1.5 x10 3/mm (ref 1.4–6.5)
Neutrophil %: 53.4 %
PLATELETS: 307 x10 3/mm (ref 150–440)
RBC: 3.99 10*6/uL — ABNORMAL LOW (ref 4.40–5.90)
RDW: 16.4 % — ABNORMAL HIGH (ref 11.5–14.5)
WBC: 2.8 x10 3/mm — ABNORMAL LOW (ref 3.8–10.6)

## 2013-09-19 LAB — HEPATIC FUNCTION PANEL A (ARMC)
Albumin: 3.1 g/dL — ABNORMAL LOW (ref 3.4–5.0)
Alkaline Phosphatase: 81 U/L
BILIRUBIN TOTAL: 0.3 mg/dL (ref 0.2–1.0)
Bilirubin, Direct: 0.1 mg/dL (ref 0.00–0.20)
SGOT(AST): 21 U/L (ref 15–37)
SGPT (ALT): 31 U/L (ref 12–78)
Total Protein: 7.2 g/dL (ref 6.4–8.2)

## 2013-10-03 LAB — CBC CANCER CENTER
Basophil #: 0 x10 3/mm (ref 0.0–0.1)
Basophil %: 0.5 %
Eosinophil #: 0.2 x10 3/mm (ref 0.0–0.7)
Eosinophil %: 3.3 %
HCT: 36.7 % — AB (ref 40.0–52.0)
HGB: 12.3 g/dL — AB (ref 13.0–18.0)
LYMPHS ABS: 0.9 x10 3/mm — AB (ref 1.0–3.6)
Lymphocyte %: 12.4 %
MCH: 31 pg (ref 26.0–34.0)
MCHC: 33.5 g/dL (ref 32.0–36.0)
MCV: 93 fL (ref 80–100)
MONO ABS: 1 x10 3/mm (ref 0.2–1.0)
MONOS PCT: 14.1 %
NEUTROS PCT: 69.7 %
Neutrophil #: 5 x10 3/mm (ref 1.4–6.5)
Platelet: 346 x10 3/mm (ref 150–440)
RBC: 3.96 10*6/uL — AB (ref 4.40–5.90)
RDW: 17.1 % — ABNORMAL HIGH (ref 11.5–14.5)
WBC: 7.2 x10 3/mm (ref 3.8–10.6)

## 2013-10-10 LAB — CBC CANCER CENTER
BASOS PCT: 1.3 %
Basophil #: 0.1 x10 3/mm (ref 0.0–0.1)
EOS PCT: 3.5 %
Eosinophil #: 0.2 x10 3/mm (ref 0.0–0.7)
HCT: 35.7 % — ABNORMAL LOW (ref 40.0–52.0)
HGB: 12.1 g/dL — ABNORMAL LOW (ref 13.0–18.0)
LYMPHS ABS: 0.8 x10 3/mm — AB (ref 1.0–3.6)
LYMPHS PCT: 11.8 %
MCH: 30.8 pg (ref 26.0–34.0)
MCHC: 33.8 g/dL (ref 32.0–36.0)
MCV: 91 fL (ref 80–100)
Monocyte #: 0.9 x10 3/mm (ref 0.2–1.0)
Monocyte %: 13.9 %
NEUTROS ABS: 4.6 x10 3/mm (ref 1.4–6.5)
Neutrophil %: 69.5 %
PLATELETS: 497 x10 3/mm — AB (ref 150–440)
RBC: 3.91 10*6/uL — ABNORMAL LOW (ref 4.40–5.90)
RDW: 15.6 % — ABNORMAL HIGH (ref 11.5–14.5)
WBC: 6.6 x10 3/mm (ref 3.8–10.6)

## 2013-10-10 LAB — HEPATIC FUNCTION PANEL A (ARMC)
Albumin: 3.1 g/dL — ABNORMAL LOW (ref 3.4–5.0)
Alkaline Phosphatase: 81 U/L
Bilirubin, Direct: 0.1 mg/dL (ref 0.00–0.20)
Bilirubin,Total: 0.3 mg/dL (ref 0.2–1.0)
SGOT(AST): 14 U/L — ABNORMAL LOW (ref 15–37)
SGPT (ALT): 16 U/L (ref 12–78)
TOTAL PROTEIN: 7.1 g/dL (ref 6.4–8.2)

## 2013-10-10 LAB — BASIC METABOLIC PANEL
ANION GAP: 9 (ref 7–16)
BUN: 18 mg/dL (ref 7–18)
CALCIUM: 8.4 mg/dL — AB (ref 8.5–10.1)
CO2: 26 mmol/L (ref 21–32)
Chloride: 94 mmol/L — ABNORMAL LOW (ref 98–107)
Creatinine: 0.93 mg/dL (ref 0.60–1.30)
EGFR (African American): >60
EGFR (Non-African Amer.): >60
Glucose: 168 mg/dL — ABNORMAL HIGH (ref 65–99)
OSMOLALITY: 265 (ref 275–301)
Potassium: 4.1 mmol/L (ref 3.5–5.1)
Sodium: 129 mmol/L — ABNORMAL LOW (ref 136–145)

## 2013-10-15 ENCOUNTER — Ambulatory Visit: Payer: Self-pay | Admitting: Oncology

## 2013-10-15 ENCOUNTER — Ambulatory Visit: Payer: Self-pay | Admitting: Internal Medicine

## 2013-10-17 LAB — CBC CANCER CENTER
Basophil #: 0 x10 3/mm (ref 0.0–0.1)
Basophil %: 0.4 %
Eosinophil #: 0 x10 3/mm (ref 0.0–0.7)
Eosinophil %: 0.6 %
HCT: 32.6 % — ABNORMAL LOW (ref 40.0–52.0)
HGB: 11 g/dL — ABNORMAL LOW (ref 13.0–18.0)
Lymphocyte #: 0.7 x10 3/mm — ABNORMAL LOW (ref 1.0–3.6)
Lymphocyte %: 18.4 %
MCH: 30.5 pg (ref 26.0–34.0)
MCHC: 33.7 g/dL (ref 32.0–36.0)
MCV: 91 fL (ref 80–100)
Monocyte #: 0.7 x10 3/mm (ref 0.2–1.0)
Monocyte %: 18.6 %
NEUTROS ABS: 2.3 x10 3/mm (ref 1.4–6.5)
Neutrophil %: 62 %
Platelet: 336 x10 3/mm (ref 150–440)
RBC: 3.6 10*6/uL — AB (ref 4.40–5.90)
RDW: 14.8 % — ABNORMAL HIGH (ref 11.5–14.5)
WBC: 3.6 x10 3/mm — AB (ref 3.8–10.6)

## 2013-11-01 LAB — CBC CANCER CENTER
Basophil #: 0.1 x10 3/mm (ref 0.0–0.1)
Basophil %: 1 %
EOS ABS: 0.2 x10 3/mm (ref 0.0–0.7)
EOS PCT: 2.6 %
HCT: 33.3 % — AB (ref 40.0–52.0)
HGB: 11.7 g/dL — ABNORMAL LOW (ref 13.0–18.0)
LYMPHS ABS: 0.6 x10 3/mm — AB (ref 1.0–3.6)
Lymphocyte %: 9.1 %
MCH: 31.4 pg (ref 26.0–34.0)
MCHC: 35.1 g/dL (ref 32.0–36.0)
MCV: 90 fL (ref 80–100)
Monocyte #: 0.8 x10 3/mm (ref 0.2–1.0)
Monocyte %: 12.6 %
NEUTROS ABS: 5 x10 3/mm (ref 1.4–6.5)
NEUTROS PCT: 74.7 %
Platelet: 522 x10 3/mm — ABNORMAL HIGH (ref 150–440)
RBC: 3.71 10*6/uL — ABNORMAL LOW (ref 4.40–5.90)
RDW: 15.4 % — ABNORMAL HIGH (ref 11.5–14.5)
WBC: 6.7 x10 3/mm (ref 3.8–10.6)

## 2013-11-01 LAB — HEPATIC FUNCTION PANEL A (ARMC)
ALBUMIN: 3 g/dL — AB (ref 3.4–5.0)
Alkaline Phosphatase: 80 U/L
BILIRUBIN DIRECT: 0.1 mg/dL (ref 0.00–0.20)
Bilirubin,Total: 0.3 mg/dL (ref 0.2–1.0)
SGOT(AST): 14 U/L — ABNORMAL LOW (ref 15–37)
SGPT (ALT): 16 U/L (ref 12–78)
Total Protein: 7.1 g/dL (ref 6.4–8.2)

## 2013-11-01 LAB — BASIC METABOLIC PANEL
Anion Gap: 10 (ref 7–16)
BUN: 14 mg/dL (ref 7–18)
Calcium, Total: 8.8 mg/dL (ref 8.5–10.1)
Chloride: 92 mmol/L — ABNORMAL LOW (ref 98–107)
Co2: 27 mmol/L (ref 21–32)
Creatinine: 0.94 mg/dL (ref 0.60–1.30)
EGFR (African American): >60
EGFR (Non-African Amer.): >60
GLUCOSE: 153 mg/dL — AB (ref 65–99)
Osmolality: 262 (ref 275–301)
Potassium: 4.3 mmol/L (ref 3.5–5.1)
SODIUM: 129 mmol/L — AB (ref 136–145)

## 2013-11-02 LAB — CANCER ANTIGEN 19-9: CA 19 9: 2302 U/mL — AB (ref 0–35)

## 2013-11-06 ENCOUNTER — Inpatient Hospital Stay: Payer: Self-pay | Admitting: Internal Medicine

## 2013-11-06 LAB — CBC WITH DIFFERENTIAL/PLATELET
Basophil #: 0.1 10*3/uL (ref 0.0–0.1)
Basophil %: 0.9 %
Eosinophil #: 0.1 10*3/uL (ref 0.0–0.7)
Eosinophil %: 1.7 %
HCT: 34.7 % — ABNORMAL LOW (ref 40.0–52.0)
HGB: 11.5 g/dL — ABNORMAL LOW (ref 13.0–18.0)
LYMPHS ABS: 0.8 10*3/uL — AB (ref 1.0–3.6)
Lymphocyte %: 9.2 %
MCH: 29.4 pg (ref 26.0–34.0)
MCHC: 33 g/dL (ref 32.0–36.0)
MCV: 89 fL (ref 80–100)
MONO ABS: 1.3 x10 3/mm — AB (ref 0.2–1.0)
MONOS PCT: 15.6 %
Neutrophil #: 6.2 10*3/uL (ref 1.4–6.5)
Neutrophil %: 72.6 %
PLATELETS: 580 10*3/uL — AB (ref 150–440)
RBC: 3.9 10*6/uL — ABNORMAL LOW (ref 4.40–5.90)
RDW: 15.6 % — ABNORMAL HIGH (ref 11.5–14.5)
WBC: 8.6 10*3/uL (ref 3.8–10.6)

## 2013-11-06 LAB — TROPONIN I: Troponin-I: <0.02 ng/mL

## 2013-11-06 LAB — MAGNESIUM: MAGNESIUM: 1.8 mg/dL

## 2013-11-06 LAB — COMPREHENSIVE METABOLIC PANEL
ALT: 21 U/L (ref 12–78)
ANION GAP: 6 — AB (ref 7–16)
Albumin: 3 g/dL — ABNORMAL LOW (ref 3.4–5.0)
Alkaline Phosphatase: 78 U/L
BILIRUBIN TOTAL: 0.3 mg/dL (ref 0.2–1.0)
BUN: 10 mg/dL (ref 7–18)
CREATININE: 0.88 mg/dL (ref 0.60–1.30)
Calcium, Total: 9.1 mg/dL (ref 8.5–10.1)
Chloride: 95 mmol/L — ABNORMAL LOW (ref 98–107)
Co2: 26 mmol/L (ref 21–32)
EGFR (African American): >60
EGFR (Non-African Amer.): >60
GLUCOSE: 128 mg/dL — AB (ref 65–99)
OSMOLALITY: 256 (ref 275–301)
Potassium: 4.5 mmol/L (ref 3.5–5.1)
SGOT(AST): 17 U/L (ref 15–37)
Sodium: 127 mmol/L — ABNORMAL LOW (ref 136–145)
Total Protein: 7.5 g/dL (ref 6.4–8.2)

## 2013-11-06 LAB — APTT: ACTIVATED PTT: 33.3 s (ref 23.6–35.9)

## 2013-11-06 LAB — LIPASE, BLOOD: Lipase: 64 U/L — ABNORMAL LOW (ref 73–393)

## 2013-11-06 LAB — PROTIME-INR
INR: 1.3
PROTHROMBIN TIME: 15.5 s — AB (ref 11.5–14.7)

## 2013-11-07 ENCOUNTER — Ambulatory Visit (HOSPITAL_COMMUNITY)
Admission: AD | Admit: 2013-11-07 | Discharge: 2013-11-07 | Disposition: A | Discharge status: Home / Self Care | Payer: Medicare Other | Source: Other Acute Inpatient Hospital | Attending: Internal Medicine | Admitting: Internal Medicine

## 2013-11-07 DIAGNOSIS — R109 Unspecified abdominal pain: Secondary | ICD-10-CM | POA: Insufficient documentation

## 2013-11-07 LAB — CBC WITH DIFFERENTIAL/PLATELET
BASOS PCT: 0.2 %
Basophil #: 0 10*3/uL (ref 0.0–0.1)
EOS PCT: 0 %
Eosinophil #: 0 10*3/uL (ref 0.0–0.7)
HCT: 31.6 % — ABNORMAL LOW (ref 40.0–52.0)
HGB: 10.9 g/dL — ABNORMAL LOW (ref 13.0–18.0)
LYMPHS ABS: 0.6 10*3/uL — AB (ref 1.0–3.6)
Lymphocyte %: 5.1 %
MCH: 30.9 pg (ref 26.0–34.0)
MCHC: 34.5 g/dL (ref 32.0–36.0)
MCV: 90 fL (ref 80–100)
MONO ABS: 1.4 x10 3/mm — AB (ref 0.2–1.0)
Monocyte %: 12.8 %
NEUTROS PCT: 81.9 %
Neutrophil #: 8.9 10*3/uL — ABNORMAL HIGH (ref 1.4–6.5)
PLATELETS: 527 10*3/uL — AB (ref 150–440)
RBC: 3.53 10*6/uL — AB (ref 4.40–5.90)
RDW: 15.7 % — AB (ref 11.5–14.5)
WBC: 10.9 10*3/uL — ABNORMAL HIGH (ref 3.8–10.6)

## 2013-11-07 LAB — LACTATE DEHYDROGENASE, PLEURAL OR PERITONEAL FLUID: LDH, Body Fluid: 103 U/L

## 2013-11-07 LAB — BODY FLUID CELL COUNT WITH DIFFERENTIAL
Basophil: 0 %
Eosinophil: 0 %
Lymphocytes: 52 %
Neutrophils: 7 %
Nucleated Cell Count: 1374 /mm3
OTHER CELLS BF: 0 %
Other Mononuclear Cells: 41 %

## 2013-11-07 LAB — BASIC METABOLIC PANEL
Anion Gap: 9 (ref 7–16)
BUN: 17 mg/dL (ref 7–18)
CHLORIDE: 95 mmol/L — AB (ref 98–107)
CO2: 22 mmol/L (ref 21–32)
Calcium, Total: 8.7 mg/dL (ref 8.5–10.1)
Creatinine: 1.22 mg/dL (ref 0.60–1.30)
EGFR (African American): >60
EGFR (Non-African Amer.): 58 — ABNORMAL LOW
GLUCOSE: 176 mg/dL — AB (ref 65–99)
Osmolality: 259 (ref 275–301)
Potassium: 5.6 mmol/L — ABNORMAL HIGH (ref 3.5–5.1)
Sodium: 126 mmol/L — ABNORMAL LOW (ref 136–145)

## 2013-11-07 LAB — URINALYSIS, COMPLETE
BACTERIA: NONE SEEN
BLOOD: NEGATIVE
Bilirubin,UR: NEGATIVE
Glucose,UR: NEGATIVE mg/dL (ref 0–75)
KETONE: NEGATIVE
Leukocyte Esterase: NEGATIVE
Nitrite: NEGATIVE
PROTEIN: NEGATIVE
Ph: 5 (ref 4.5–8.0)
RBC,UR: 3 /HPF (ref 0–5)
SPECIFIC GRAVITY: 1.015 (ref 1.003–1.030)

## 2013-11-07 LAB — PROTEIN, BODY FLUID: Protein, Body Fluid: 4.7 g/dL

## 2013-11-11 LAB — BODY FLUID CULTURE

## 2013-11-12 ENCOUNTER — Ambulatory Visit: Payer: Self-pay | Admitting: Oncology

## 2013-11-12 ENCOUNTER — Ambulatory Visit: Payer: Self-pay | Admitting: Internal Medicine

## 2013-12-13 DEATH — deceased

## 2015-01-04 NOTE — Discharge Summary (Signed)
PATIENT NAME:  Kurt Chapman, Kurt Chapman MR#:  [id number] DATE OF BIRTH:  01-29-1941 DATE OF ADMISSION:  05/23/2013 DATE OF DISCHARGE:  05/26/2013  DISCHARGE DIAGNOSES:  1.  Metastatic cancer, possible adenocarcinoma, under investigation of laboratory results. Need to follow in oncology clinic.  2.  Hyponatremia, possibly due to dehydration.  3.  Diabetes.  4.  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  5.  Hypertension.  6.  Altered mental status on admission due to hyponatremia.   CONDITION ON DISCHARGE: Stable.   CODE STATUS: FULL CODE.   MEDICATIONS ON DISCHARGE:  1.  Prilosec 40 mg oral delayed-release capsule 20 once a day.  2.  Metformin 500 mg two times a day.  3.  Glipizide extended release 2.5 mg oral tablet once a day.  4.  Aspirin 81 mg two tablets once a day.  5.  Simvastatin 20 mg once a day.  6.  Acetaminophen and hydrocodone every four hours as needed for pain.  7.  Magnesium oxide oral liquid 30 mL 3 times a day as needed for constipation.  8.  Mirtazapine 7.5 mg oral once a day.  9.  Alprazolam 0.25 mg oral tablet, one tablet every 8 hours as needed for anxiety.  10.  Glucerna 3 times a day.   DIET ON DISCHARGE: Regular. Diet consistency: Regular.   ACTIVITY: As tolerated.   TIMEFRAME TO FOLLOW-UP: Within 1 to 2 weeks in surgery and cancer clinic.   Advised to check sodium level in 1 to 2 weeks in clinic with primary care physician.   HISTORY OF PRESENT ILLNESS: The patient has not been feeling well for the past 2 to 4 weeks associated with abdominal pain and distention. Also with constipation and left-sided cervical lymphadenopathy, for which he was scheduled to have follow-up with primary care physician for further work-up. He also had lost appetite, about 10 pounds, in 2 to 4 weeks. The patient had colonoscopy by Dr. Gustavo Lah in June 2013 which was normal.   Sodium was found in the ER at 121. CAT scan of the neck and chest with contrast was done and revealed multiple mediastinal and  hilar lymphadenopathies, left axilla lymphadenopathy, and mild ascites.   Was admitted for further management. On the next day, 10th September, biopsy of the cervical lymph node was done by Dr. Bary Castilla, and Dr. Ma Hillock followed in the hospital for oncology issues.   The patient had some altered mental status on presentation due to hyponatremia. Sodium was 121. Received IV fluids and had improvement in his sodium level. He was found to be dehydrated on presentation. After IV fluid hydration and sodium level coming up, he had significant improvement in the mental status and was feeling fine. The patient was started on diet and he was tolerating it very well, so we decided to discharge him home and advised to follow up in oncology and surgery clinic for further management for possible metastatic cancer.   OTHER MEDICAL ISSUES: Diabetes. HbA1c was 7.2. We resumed his glipizide once he was he was tolerating oral diet well. Abdominal distention and poor oral intake. Abdominal CT showed abnormal omentum. Dronabinol was added for appetite stimulation, and the patient had significant improvement in his appetite and  was tolerating it very well, so we discharged with further management of oncology clinic.   HOSPITAL CONSULTATIONS: Oncology consult with Dr. Ma Hillock Sunday and surgery consult with Dr. Hervey Ard.   IMPORTANT LABORATORY RESULTS IN THE HOSPITAL: CT of the neck and chest: No focal cervical abnormality,  multiple mediastinal, hilar, and left axillary revealed small lymph nodes, small pleural effusion, mild ascites. Magnesium 1.8. CT of the head without contrast showed atrophy with microangiopathy but no acute internal cranial  abnormalities. Sodium level was 124. On follow-up, it came up to 131 on the 11th of September. Echocardiogram was done and it was ejection fraction 60% to 65%, left ventricular systolic function normal. Normal right ventricular size and systolic function. Mild tricuspid and aortic  regurgitation. Pathology report of lymph node biopsy was positive for metastatic carcinoma, adenocarcinoma.   TOTAL TIME SPENT ON THIS DISCHARGE: 45 minutes.    ____________________________ Ceasar Lund Anselm Jungling, MD vgv:np D: 05/29/2013 15:15:00 ET T: 05/29/2013 16:14:34 ET JOB#: [id number]  cc: Sandeep R. Ma Hillock, MD Robert Bellow, MD Jerrell Belfast, MD Ceasar Lund Anselm Jungling, MD, <Dictator> Vaughan Basta MD ELECTRONICALLY SIGNED 06/03/2013 15:11 Vaughan Basta MD ELECTRONICALLY SIGNED 06/09/2013 18:32

## 2015-01-04 NOTE — Consult Note (Signed)
ONCOLOGY followup - still having constipation, has not taken MOM yet. Vague abdominal pain and bloating same. Had left lower neck node surgical bx yesterday, per d/w Dr.Olney in pathology it is mucin positive adenoCa, immunostains pending.no fever. No confusion.A, O x 3, NAD          vitals - afebrile, stable          lungs - b/l CTA          abd - mild distension, vague tenderness, BS +          ext - no major edema9/10 - WBC 6.2, Hb 14.3, platelets 301K, Cr 0.8, Na 53.  74 year old gentleman with newly diagnosed metastatic adenoCa on left lower neck node biopsy on 05/24/13, immunnostains pending to see if we can determine primary. PET scan shows mediastinal adenopathy and possible mesenteric implants. CA 19-9 also remarkably elevated at > 2000. Will await final path report, meanwhile request Dr.Byrnett for Portacath placement for IV access for chemotherapy. Continue Senokot-S BID + MOM prn for ongoing constipation. Continue current supportive treatment.  The patient explained that malignancy is stage IV and incurable and treatments offered are with palliative intent only. He is agreeable to this plan.     Electronic Signatures: Jonn Shingles (MD)  (Signed on 12-Sep-14 01:14)  Authored  Last Updated: 12-Sep-14 01:14 by Jonn Shingles (MD)

## 2015-01-04 NOTE — Op Note (Signed)
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 Need for central venous access.   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 Need for central venous access.   OPERATIVE PROCEDURE: Left subclavian Power Port placement with ultrasound and fluoroscopic guidance.   SURGEON: Robert Bellow, M.D.   ANESTHESIA: Attended local under Dr. Benjamine Mola, 14 mL of 1% plain Xylocaine.   ESTIMATED BLOOD LOSS: Minimal.   CLINICAL NOTE: This 74 year old male was recently evaluated for abdominal pain and showed evidence of omental caking. Biopsy of a left posterior cervical node suggested adenocarcinoma of pancreaticobiliary origin. He is felt to be a candidate for chemotherapy, and central venous access was requested by his treating oncologist.   OPERATIVE NOTE: With the patient comfortably supine on the operating table, the area was prepped with ChloraPrep and draped. Ultrasound was used to confirm patency of the subclavian vein. This was then cannulated on a single stick under ultrasound guidance. The guidewire was advanced followed by the dilator. The catheter was positioned just inside the right atrium to allow good flow on irrigation and aspiration. It was tunneled to a pocket in the left anterior chest where the port was fixated to the deep tissue with 3-point fixation using 3-0 Prolene. The catheter easily irrigated and aspirated and was flushed with 10 mL of saline at the end of the procedure. The wound was closed in layers with 3-0 Vicryl to the adipose tissue and a running 4-0 Vicryl subcuticular suture for the skin. Benzoin and Steri-Strips followed by Telfa and Tegaderm dressing was applied.   The patient tolerated the procedure well and was taken to the recovery room in stable condition.    ____________________________ Robert Bellow, MD jwb:gb D: 05/29/2013 21:38:04 ET T: 05/29/2013 23:09:32 ET JOB#: [id number]  cc: Robert Bellow, MD, <Dictator> Sandeep R. Ma Hillock, MD Jerrell Belfast, MD Earlisha Sharples Amedeo Kinsman MD ELECTRONICALLY SIGNED 05/31/2013 9:16

## 2015-01-04 NOTE — Consult Note (Signed)
PATIENT NAME:  Kurt Chapman, Kurt Chapman MR#:  [id number] DATE OF BIRTH:  02-15-1941  DATE OF CONSULTATION:  05/24/2013  REFERRING PHYSICIAN:  Nicholes Mango, MD CONSULTING PHYSICIAN:  Robert Bellow, MD  DATE OF DICTATION: 05/27/2013   INDICATION FOR CONSULTATION: Abdominal pain, weight loss, lymphadenopathy.   PRIMARY PHYSICIAN: Jerrell Belfast, MD  CLINICAL NOTE: This 74 year old male presented to the hospital with abdominal pain. He was also identified with hyponatremia. A 10-pound weight loss and constipation were reported. Evaluation included a CT scan of the abdomen that showed evidence of omental thickening, ascites and small pleural effusions. No primary colonic pathology. Clinical examination showed evidence of a palpable posterior cervical node for which biopsy was requested.   The patient's general health has been good. He continues to actively farm beef cattle.   PAST MEDICAL HISTORY: Notable for essential hypertension, diabetes mellitus controlled with oral medications, and hyperlipidemia.   PAST SURGERY: Consists of appendectomy.   PHYSICAL EXAMINATION:  GENERAL: Showed the patient to be awake, alert and orientated.  HEAD AND NECK: Notable for a 1 to 1.5 cm palpable node in the left posterior cervical triangle. No thyromegaly or cervical adenopathy. No supraclavicular adenopathy.  LYMPHATIC: Axilla and inguinal areas were clear.  CHEST: Clear to auscultation.  CARDIAC: Showed a regular rhythm without murmur or gallop.  ABDOMEN: Nondistended. Bowel sounds were normal. Mild tenderness with palpation deep in the right side was noted. No peritoneal irritation, guarding or referred pain. No peripheral edema was noted.   SUMMARY OF ADMISSION LABORATORY STUDIES: Notable for a hemoglobin of 14.3 with an MCV of 88, platelet count of 301,000, white blood cell count of 6200. Elevated hemoglobin A1c at 6.9. Depressed serum sodium at 129, potassium 3.8, chloride 96. Of note, the patient's serum  sodium on 05/22/2013 was 121.   CT scan of the head, neck and chest dated 05/22/2013 showed no evidence of intracranial lesion. No primary pathology in the head and neck appreciated. No significant cervical adenopathy was noted. Previously completed CT scan of the abdomen and pelvis dated 05/16/2013 was reviewed, showing bilateral pleural effusions with compressive atelectasis as well as omental thickening and ascites. No gross lesions were appreciated in the pancreas.   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   Arrangements are in place for a biopsy of the left posterior cervical lymph node to be completed on 05/25/2013. If this is nondiagnostic, arrangements will be made for laparoscopy and omental biopsy.   ____________________________ Robert Bellow, MD jwb:OSi D: 05/27/2013 10:56:00 ET T: 05/27/2013 11:31:29 ET JOB#: [id number]  cc: Robert Bellow, MD, <Dictator> Jerrell Belfast, MD Rhett Bannister. Ma Hillock, MD Shahad Mazurek Amedeo Kinsman MD ELECTRONICALLY SIGNED 05/28/2013 10:20

## 2015-01-04 NOTE — Discharge Summary (Signed)
PATIENT NAME:  Kurt Chapman, Kurt Chapman MR#:  [id number] DATE OF BIRTH:  10/05/1940  DATE OF ADMISSION:  05/23/2013 DATE OF DISCHARGE:  05/26/2013  DISCHARGE DIAGNOSES: 1.  Metastatic cancer, possibly adenocarcinoma.  2.  Hyponatremia.  3.  Diabetes.  4.  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  5.  Hypertension.  6.  Altered mental status on admission due to hyponatremia.  DICTATION ENDS HERE (Dictation Anomaly) <<MISSING TEXT>>   ____________________________ Ceasar Lund. Anselm Jungling, MD vgv:ea D: 05/30/2013 22:21:41 ET T: 05/30/2013 23:23:01 ET JOB#: [id number]  cc: Ceasar Lund. Anselm Jungling, MD, <Dictator>

## 2015-01-04 NOTE — Op Note (Signed)
PATIENT NAME:  Kurt Chapman, Kurt Chapman MR#:  [id number] DATE OF BIRTH:  Apr 12, 1941  DATE OF PROCEDURE:  05/24/2013  PREOPERATIVE DIAGNOSIS: Left posterior cervical adenopathy, suspected intra-abdominal malignancy.   POSTOPERATIVE DIAGNOSIS:  Left posterior cervical adenopathy, suspected intra-abdominal malignancy.  OPERATIVE PROCEDURE:  Open excision of left posterior cervical lymph node.   OPERATING SURGEON:  Hervey Ard, MD   ANESTHESIA:  Attended local, 0.5% Xylocaine with 0.25% Marcaine with [place].   ESTIMATED BLOOD LOSS:  Less than 1 mL.   CLINICAL NOTE:  This 74 year old male had presented with abdominal pain and weight loss. CT scan showed evidence of omental thickening and some intra-abdominal nodes. The CA19-9 was elevated at nearly 2000. A clinically palpable node was appreciated in the left posterior cervical chain. The patient was felt to be a candidate for biopsy.   OPERATIVE NOTE:  With the patient comfortably supine on the operating table and the head rolled to the right, the area was prepped with ChloraPrep and draped. Marcaine and Xylocaine were infiltrated for analgesia. This was well-tolerated. A small skin line incision was made and carried down through the skin and subcutaneous tissue. Care was taken to avoid the 11th cranial nerve. The hard node was approximately a centimeter in diameter. This was excised with cautery dissection. Hemostasis was with electrocautery and 3-0 Vicryl ties. The wound was closed in layers with 3-0 Vicryl running suture to the deep layer and a running 4-0 Vicryl subcuticular suture for the skin. Dermabond was applied and the patient taken to the recovery room in stable condition. Pathology showed metastatic carcinoma on frozen section.    ____________________________ Robert Bellow, MD jwb:ce D: 05/24/2013 17:17:15 ET T: 05/24/2013 18:21:15 ET JOB#: [id number]  cc: Robert Bellow, MD, <Dictator> Jerrell Belfast, MD Rhett Bannister. Ma Hillock,  MD Kirstie Peri Caryn Section, MD  Nikkia Devoss Amedeo Kinsman MD ELECTRONICALLY SIGNED 05/26/2013 16:55

## 2015-01-04 NOTE — Consult Note (Signed)
ONCOLOGY followup note - abdominal vague pain and bloating same. Still constipated. Had left lower neck node surgical bx today.no fever. No confusion.A, O x 3, NAD          vitals - afebrile          lungs - b/l CTA          abd - mild distension, vague tenderness, BS +WBC 6.2, Hb 14.3, platelets 301K, Cr 0.8, Na 65.  74 year old gentleman with a past medical history as described above, more recently having persistent/progressive abdominal pain and bloating sensation causing difficulty eating, unintentional weight loss, poor oral intake.  Recent evaluation with CT abdomen and pelvis showed omental thickening along with a CT scan of the left neck and chest showing mediastinal and axillary adenopathy.  CA 19-9 also remarkably elevated at > 2000.  Had left lower neck node surgical bx today, prelim path is carcinoma.  The patient and family present explained about this finding. Will await final path report, meanwhile get PET scan to identify primary tumor and extent of metastatic disease. Add Senokot-S BID + MOM prn for ongoing constipation. Continue current supportive treatment.  The patient explained above, he is agreeable to this plan.   Electronic Signatures: Kurt Chapman (MD)  (Signed on 10-Sep-14 22:51)  Authored  Last Updated: 10-Sep-14 22:51 by Kurt Chapman (MD)

## 2015-01-04 NOTE — Discharge Summary (Signed)
PATIENT NAME:  Kurt Chapman, Kurt Chapman MR#:  [id number] DATE OF BIRTH:  Feb 28, 1941  DISCHARGE DIAGNOSES:  1.  Metastatic cancer, possible adenocarcinoma, under investigation of laboratory results. Need to follow in oncology clinic.  2.  Hyponatremia, possibly due to dehydration.  3.  Diabetes.  4.  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  5.  Hypertension.  6.  Altered mental status on admission due to hyponatremia.   CONDITION ON DISCHARGE: Stable.   CODE STATUS: FULL CODE.   MEDICATIONS ON DISCHARGE:  1.  Prilosec 40 mg oral delayed-release capsule 20 once a day.  2.  Metformin 500 mg two times a day.  3.  Glipizide extended release 2.5 mg oral tablet once a day.  4.  Aspirin 81 mg two tablets once a day.  5.  Simvastatin 20 mg once a day.  6.  Acetaminophen and hydrocodone every four hours as needed for pain.  7.  Magnesium oxide oral liquid 30 mL 3 times a day as needed for constipation.  8.  Mirtazapine 7.5 mg oral once a day.  9.  Alprazolam 0.25 mg oral tablet (Dictation Anomaly)<<MISSING TEXT>>  every 8 hours as needed for anxiety.  10.  Glucerna 3 times a day.   DIET ON DISCHARGE: Regular. Diet consistency: Regular.   ACTIVITY: As tolerated.   TIMEFRAME TO FOLLOW-UP: Within 1 to 2 weeks in surgery and cancer clinic.   Advised to check sodium level in 1 to 2 weeks in clinic with primary care physician.   HISTORY OF PRESENT ILLNESS: The patient has not been feeling well for the past 2 to 4 weeks associated with abdominal pain and distention. Also with constipation and left-sided cervical lymphadenopathy, for which he was scheduled to have follow-up with primary care physician for further work-up. He also had lost appetite, about 10 pounds, in 2 to 4 weeks. The patient had colonoscopy by Dr. Gustavo Lah in June 2013 which was normal.   Sodium was found in the ER at 121. CAT scan of the neck and chest with contrast was done and revealed multiple mediastinal and hilar lymphadenopathies, left axilla  lymphadenopathy, and mild ascites.   Was admitted for further management. On the next day, 10th September, biopsy of the cervical lymph node was done by Dr. Bary Castilla, and Dr. Ma Hillock followed in the hospital for oncology issues.   The patient had some altered mental status on presentation due to hyponatremia. Sodium was 121. Received IV fluids and had improvement in his sodium level. He was found to be dehydrated on presentation. After IV fluid hydration and sodium level coming up, he had significant improvement in the mental status and was feeling fine. The patient was started on diet and he was tolerating it very well, so we decided to discharge him home and advised to follow up in oncology and surgery clinic for further management for possible metastatic cancer.   OTHER MEDICAL ISSUES: Diabetes. HbA1c was 7.2. We resumed his glipizide once he was he was tolerating oral diet well. Abdominal distention and poor oral intake. Abdominal CT showed abnormal omentum. Dronabinol was added for appetite stimulation, and the patient had significant improvement in his appetite and  was tolerating it very well, so we discharged with further management of oncology clinic.   HOSPITAL CONSULTATIONS: Oncology consult with Dr. Ma Hillock Sunday and surgery consult with Dr. Hervey Ard.   IMPORTANT LABORATORY RESULTS IN THE HOSPITAL: CT of the neck and chest: No focal cervical abnormality, multiple mediastinal, hilar, and left (Dictation Anomaly)<<MISSING TEXT>>  revealed small lymph nodes, small pleural effusion, mild ascites. Magnesium 1.8. CT of the head without contrast showed atrophy with microangiopathy but no acute internal cranial  abnormalities. Sodium level was 124. On follow-up, it came up to 131 on the 11th of September. Echocardiogram was done and it was ejection fraction 60% to 65%, left ventricular systolic function normal. Normal right ventricular size and systolic function. Mild tricuspid and aortic  regurgitation. Pathology report of lymph node biopsy was positive for metastatic carcinoma, adenocarcinoma.   TOTAL TIME SPENT ON THIS DISCHARGE: 45 minutes.    ____________________________ Ceasar Lund Anselm Jungling, MD vgv:np D: 05/29/2013 15:15:09 ET T: 05/29/2013 16:14:34 ET JOB#: [id number]  cc: Ceasar Lund. Anselm Jungling, MD, <Dictator> Sandeep R. Ma Hillock, MD Robert Bellow, MD Jerrell Belfast, MD

## 2015-01-04 NOTE — Consult Note (Signed)
PATIENT NAME:  Kurt Chapman, Kurt Chapman MR#:  [id number] DATE OF BIRTH:  1940-11-20  DATE OF CONSULTATION:  05/23/2013  REFERRING PHYSICIAN:  Dr. Margaretmary Eddy CONSULTING PHYSICIAN:  Annaclaire Walsworth R. Ma Hillock, MD  REASON FOR CONSULTATION:  Hilar adenopathy and weight loss.   HISTORY OF PRESENT ILLNESS:  The patient is a 74 year old gentleman who has been admitted to the hospital earlier today with complaints of abdominal distention associated with pain, constipation and a 10 pound weight loss.  The patient has had symptoms of recurrent abdominal pain for the last few months, but more constant in the last 2 to 3 weeks.  He had a CT scan of the abdomen and pelvis done recently which showed omental thickening with mild ascites.  The patient also had left lower neck nodule palpable and was evaluated at the [facility] a few days ago.  He had CT scan of the neck and chest with it showing mediastinal and axillary adenopathy.  CA 19-9 also remarkably abnormal at greater than 2000.  The patient was due to see surgeon, Dr. Bary Castilla as outpatient tomorrow, but has been admitted with worsening hyponatremia along with worsening abdominal pain, decreased oral intake and progressive weakness.  Currently states that he is feeling slightly better, but still has abdominal bloating and gas when he tries to eat.   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   FAMILY HISTORY:  Remarkable for Parkinson's disease and peripheral vascular disease.  Denies malignancy.   SOCIAL HISTORY:  Denies ongoing smoking, chews tobacco.  Denies recreational drug usage, occasional alcohol intake only.  Lives with his wife.   REVIEW OF SYSTEMS: CONSTITUTIONAL:  Fainting on exertion.  No fevers or chills.  No night sweats.  HEENT:  No headaches or dizziness.  No epistaxis, ear or jaw pain.  CARDIAC:  No angina, palpitation, orthopnea or PND.  LUNGS:  No  new dyspnea, cough, hemoptysis or chest pain.  GASTROINTESTINAL:  As in HPI.  No diarrhea.  Tends to constipate.  No bright red blood in stools or melena.  GENITOURINARY:  No dysuria or hematuria.  NEUROMUSCULOSKELETAL:  No new bone pains.  SKIN:  No new rashes or pruritus.  HEMATOLOGIC:  No obvious bleeding symptoms.  NEUROLOGIC:  No new focal weakness, seizures or loss of consciousness.   ENDOCRINE:  No polyuria or polydipsia.   PHYSICAL EXAMINATION: GENERAL:  The patient is sitting in bed, alert and oriented, in no acute distress.  No icterus.  VITAL SIGNS:  97.3, 70, 119, 124/74, 97% on room air.  HEENT:  Normocephalic, atraumatic.  Extraocular movements intact.  Sclerae anicteric.   NECK:  Supple.  Small lymph node palpable in the left lower neck measuring around 1 cm.  CARDIOVASCULAR:  S1, S2, regular rate and rhythm.  LUNGS:  Bilateral good air entry, no crepitations or rhonchi noted.  ABDOMEN:  Soft, nontender, mildly distended, no hepatosplenomegaly clinically.  EXTREMITIES:  No major edema or cyanosis.  SKIN:  Shows no generalized rashes or major bruising.  LYMPHATICS:  Small axillary adenopathy measuring around 1 cm.  NEUROLOGIC:  Grossly nonfocal, cranial nerves intact.   LABORATORY, DIAGNOSTIC, AND RADIOLOGICAL DATA:  Ammonia less than 25, BNP 115, creatinine 0.72, BUN 4, sodium 128, potassium 3.8, calcium 8.5.  TSH normal at 2.8.  Recent CBC showed hemoglobin 15, WBC 7400 and LFT was unremarkable including albumin of 3.8.   IMPRESSION AND RECOMMENDATIONS:  A 74 year old gentleman with a  past medical history as described above, more recently having persistent/progressive abdominal pain and bloating sensation causing difficulty eating, unintentional weight loss, poor oral intake.  Recent evaluation with CT abdomen and pelvis showed omental thickening along with a CT scan of the left neck and chest showing mediastinal and axillary adenopathy.  CA 19-9 also remarkably elevated.  The  patient explained that given findings of adenopathy on CT scan of the chest, it raises possibility of malignancy like lymphoma, otherwise he also could have intra-abdominal malignancy with metastatic disease.  Plan at this time is to pursue PET scan evaluation tomorrow.  He is also awaiting evaluation by surgeon, Dr. Bary Castilla to consider biopsy of more accessible area to get tissue diagnosis.  Otherwise, recent CEA and PSA were normal.  We will follow up after PET scan is done and make further plan of management.  Continue current supportive treatment.  The patient explained above, agreeable to this plan.   Thank you for the referral, please feel free to contact me if any additional questions.     ____________________________ Rhett Bannister Ma Hillock, MD srp:ea D: 05/23/2013 23:32:57 ET T: 05/24/2013 01:55:56 ET JOB#: [id number]  cc: Trevor Iha R. Ma Hillock, MD, <Dictator> Alveta Heimlich MD ELECTRONICALLY SIGNED 05/24/2013 10:43

## 2015-01-04 NOTE — H&P (Signed)
PATIENT NAME:  Kurt Chapman, Kurt Chapman MR#:  [id number] DATE OF BIRTH:  03-14-1941  DATE OF ADMISSION:  05/23/2013  PRIMARY CARE PHYSICIAN: Margarita Rana, MD   REFERRING PHYSICIAN: Dr. Karma Greaser  CHIEF COMPLAINT: Abdominal distention associated with pain, constipation and 10 pounds weight loss.   HISTORY OF PRESENT ILLNESS: The patient has been not feeling well for the past 2 to 4 weeks associated with abdominal pain and distention. This is also associated with constipation. The patient also had left cervical lymphadenopathy regarding which he is scheduled to follow up with his primary care physician for further work-up. He is reporting that he has lost appetite and lost approximately 10 pounds and past 2 to 4 weeks. This is quite unusual to him. The patient used enema yesterday regarding his constipation. The patient had a colonoscopy done by Dr. Gustavo Lah in June 2013 which was apparently normal as reported by the patient. In the ER, the patient's sodium was found to be at 121, which was at 124 on September 7. The patient has received 1 liter fluid bolus. As patient seemed to be slightly off,  CT head was done which was normal. CAT scan of the neck and chest with contrast has revealed multiple mediastinal hilar and left axillary lymphadenopathy with small pleural effusions and mild ascites. The patient denies any chest pain or diarrhea. No other complaints.   PAST MEDICAL HISTORY: Hypertension, diabetes mellitus and hyperlipidemia.   PAST SURGICAL HISTORY: Appendectomy.   PSYCHOSOCIAL HISTORY: Lives at home with wife, chews tobacco, occasional intake of alcohol. Denies any illicit drug usage.   FAMILY HISTORY: Mom had a history of Parkinson disease, stroke and dad had history of peripheral vascular disease.   REVIEW OF SYSTEMS:  CONSTITUTIONAL:  Denies any fever, complaining of fatigue.  EYES: Denies blurry vision, glaucoma.   EARS, NOSE, THROAT: No epistaxis, discharge.  RESPIRATION: No cough, chronic  obstructive pulmonary disease.  CARDIOVASCULAR: Denies any chest the chest pain or palpitations.  GASTROINTESTINAL: Denies nausea, Complaining of constipation, and diffuse abdominal distention with pain.  GENITOURINARY: No hernia. No hematuria.  ENDOCRINE: Denies polyuria, nocturia, thyroid problems.  HEMATOLOGIC/LYMPHATIC:  No anemia, easy bruising or bleeding.  INTEGUMENTARY: No acne, rash, lesions.  MUSCULOSKELETAL: No joint pain in the neck, back, shoulder.  NEUROLOGIC: No vertigo, ataxia. Has chronic history of diabetes mellitus.  PSYCHIATRIC: No ADD, OCD.    HOME MEDICATIONS: Simvastatin 20 mg once daily, Prilosec 40 mg once daily, Rapaflo one capsule once a day, Norco 1 tablet by mouth q. 6 hours, metformin 500 mg 2 times a day, glipizide 2.5 mg 1 tablet p.o. once daily, aspirin 81mg  once daily, amlodipine/ benazepril 1 capsule by mouth once a day, alprazolam 0.5 mg orally 1 tablet p.o. once a day.   PHYSICAL EXAMINATION: VITAL SIGNS: Temperature 98.3, pulse 79, respirations 18, blood pressure 140/83, pulse oximetry 97%.  GENERAL APPEARANCE: Not in acute distress. Moderately built, well nourished.  HEENT: Normocephalic, atraumatic. Pupils are equal, reacting light and accommodation. No scleral icterus. No conjunctival injection. No sinus tenderness. No postnasal drip. Dry mucous membranes.  NECK: Supple. No JVD. No thyromegaly. He has palpable, rubbery lymph node in the left cervical area behind supraclavicular region. Also positive left axillary lymphadenopathy, which is nontender. Range of motion of the neck is intact.  LUNGS: Clear to auscultation bilaterally. No accessory muscle use. No anterior chest wall tenderness on palpation.  CARDIAC: S1, S2 normal. Regular rate and rhythm. No murmurs.  GASTROINTESTINAL: Soft, distended. Bowel sounds are positive in  all four quadrants. Nontender, nondistended. No masses felt. No rebound tenderness.  NEUROLOGIC: Awake, alert, oriented x 3. The  patient is hard of hearing. Otherwise, follows verbal commands appropriately. Cranial nerves II through XII,  motor and sensory are grossly intact. Reflexes are 2+.   EXTREMITIES: No edema. No cyanosis. No clubbing.  PSYCHIATRIC: Normal mood and affect.   LABORATORY, DIAGNOSTIC AND RADIOLOGIC DATA:  CAT scan of the head without contrast has revealed atrophy with microangiopathy. No acute intracranial abnormality, stable appearance.   CT of the neck and chest with contrast has revealed no focal pelvic abnormality identified. Multiple mediastinal hilar and left axillary small lymph nodes, small pleural effusions, mild ascites.   Three view abdominal x-ray including PA of the chest: Subsegmental areas of atelectasis or linear fibrosis. No definitive bowel obstruction or perforation. It is evident.   Glucose 136, BUN 4, creatinine 0.74, sodium 121, potassium 3.7, chloride 89, CO2 25. GFR greater than 60. Anion gap 7, calcium serum osmolality 240, calcium 8.7, magnesium 1.8. Lipase is 107. LFTs normal. CBC normal.   Urinalysis: Straw-colored, clear in appearance, blood negative, protein negative nitrite, leukocyte esterase negative.   A 12-lead EKG: Normal sinus rhythm with normal PR and QRS intervals, nonspecific. No ST-T wave changes.   ASSESSMENT AND PLAN: A 74 year old Caucasian male presenting to the ER with a chief complaint of generalized abdominal distention associated with constipation, decreased appetite and 10 pounds weight loss on past 2 to 4 week period, diagnosed with sodium of 121. Will be admitted with the following assessment and plan:  1.  Hyponatremia, probably SIADH versus dehydration. We will rule out thyroid disorders. We will admit him to telemetry. The patient has received normal saline with bolus. We  will continue gentle hydration with normal saline at 60 mL per hour and monitor CBC and BMP, urine osmolarity and electrolytes. Check TSH and fasting lipid panel. Nephrology  consult is placed.  2.  Hilar lymphadenopathy, left axillary lymphadenopathy with ascites and 10 pound weight loss. Etiology is unclear. Rule out any cancerous etiology. Hematology/oncology consult is placed.  3.  Chronic history of diabetes mellitus. We will continue insulin sliding scale as the patient is on clear liquid diet, in view of abdominal distention and pain.  4.  Hypertension. The patient is on clear liquid diet at this time. We will hold off on antihypertensive medications.  5.  Hyperlipidemia. Check fasting lipid panel.   Diagnosis and plan of care was discussed in detail with the patient and his wife at bedside. They both verbalized understanding of the plan.   CODE STATUS: Full code. Wife is medical power of attorney.   TOTAL TIME SPENT ON ADMISSION:  50 minutes.     ____________________________ Nicholes Mango, MD ag:cc D: 05/23/2013 01:04:10 ET T: 05/23/2013 04:08:29 ET JOB#: [id number]  cc: Nicholes Mango, MD, <Dictator> Nicholes Mango MD ELECTRONICALLY SIGNED 06/03/2013 6:57

## 2015-01-05 NOTE — Discharge Summary (Signed)
PATIENT NAME:  Kurt Chapman, Kurt Chapman MR#:  [id number] DATE OF BIRTH:  February 23, 1941  DATE OF ADMISSION:  11/06/2013 DATE OF DISCHARGE:    The patient will be discharged to [facility] when the bed is available.   PRIMARY CARE PHYSICIAN: Jerrell Belfast, MD  ONCOLOGIST: Rhett Bannister. Ma Hillock, MD  PRESENT DIAGNOSES: 1.  Acute respiratory failure due to bilateral pleural effusions.  2.  Abdominal pain secondary to partial small bowel ileus due to constipation.  3.  Hyponatremia.  4.  Hyperkalemia. 5.  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 The patient also has omental caking.   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 The patient also had some cough and epigastric pain when he was seen in the ER. The patient's abdominal CT showed a lot of stools with extensive omental caking and also, chest CT showed bilateral pleural effusions. The patient's O2 sats were 93% on room air. 1.  Admitted to ICU for impending respiratory failure secondary to bilateral pleural effusions. The patient was started on oxygen along with close monitoring. The patient was seen by interventional radiology, had left-sided thoracocentesis done today, and about 1.5 liters of fluid drained from left side. The patient was seen by Dr. Leia Alf. The patient's family chose to transfer to [facility], so Dr. Ma Hillock arranged for transfer. Attending physician is Dr. Melvyn Neth, and the patient will be going to [facility] when the bed is available. The patient right now is in ICU. He feels better after thoracocentesis. His O2 sats are 94% on 2 liters and temperature is 98.2, heart rate 88, blood pressure 104/61.  2.  Abdominal pain. The patient has been getting fentanyl patch at home, 12 mcg 2 patches, along with oxycodone 5 mg q.6 hours. The  patient did not get fentanyl patch 2 patches here because he was a little hypotensive, and the patient can resume that when the BP permits. 3.  History of constipation. The patient did receive Relistor in the Emergency Room along with lactulose. The patient right now is on senna. The patient did have a BM yesterday in the ER, and he started on clear liquids. He did have some vomiting this morning, but looks like it is getting better. He wanted to eat, so we started on clear liquids and we continued the stool softeners. The patient will be going to [facility] when the bed is available.   CURRENT MEDICATIONS: Xanax 0.5 mg p.o. at bedtime, baclofen 5 mg p.o. b.i.d., fentanyl patch 12 mcg every 3 days, lactulose 30 mL t.i.d., Megace 800 mg daily, mirtazapine 50 mg at bedtime, pantoprazole 40 mg p.o. b.i.d. The patient is on senna 1 tablet p.o. at bedtime and simvastatin 20 mg p.o. at bedtime.   CODE STATUS: Dr. Ma Hillock discussed with him about the code status. He wanted to be DNR. Those orders were placed.  LABORATORY AND RADIOLOGICAL DATA: The patient's labs showed CEA of 2302 on 18th of February. Lipase 64, magnesium 1.8. CT chest, abdomen and pelvis showed moderate to large pleural effusions, lower lobe atelectasis. The patient's liver, spleen, pancreas, adrenals are normal. Gallbladder unremarkable. The patient has omental caking, which has mildly progressed, and also moderate to large pleural effusions. The patient's sodium was 127 on 23rd of February, potassium 4.5, chloride 95, bicarbonate 26, BUN 10, creatinine 0.88, glucose 128. Abdominal x-ray today shows moderately distended  stomach; partial small bowel obstruction cannot be excluded. Pleural fluid cytology is pending. So far, the cultures from the pleural fluid did not show any growth.  He will be going to [facility] when the bed is available.   TIME SPENT: More than 30 minutes. This is a stat discharge summary.  ____________________________ Epifanio Lesches, MD sk:jcm D: 11/07/2013 15:15:18 ET T: 11/07/2013 16:13:26 ET JOB#: [id number]  cc: Epifanio Lesches, MD, <Dictator> Epifanio Lesches MD ELECTRONICALLY SIGNED 11/09/2013 14:33

## 2015-01-05 NOTE — Consult Note (Signed)
PATIENT NAME:  Kurt Chapman, Kurt Chapman MR#:  [id number] DATE OF BIRTH:  Sep 04, 1941  DATE OF CONSULTATION:  11/07/2013  REFERRING PHYSICIAN:  Tana Conch. Leslye Peer, MD CONSULTING PHYSICIAN:  Marquavius Scaife R. Ma Hillock, MD  REASON FOR CONSULTATION: Metastatic adenocarcinoma, pleural effusions.   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 More recently, he has been on chemotherapy with gemcitabine/Abraxane. He did have some response to treatment in December, but repeat CA 19-9  in February had again risen back up to 2200. CT scan also indicated progressive recurrence of metastatic disease in the abdomen. The patient also has been having intermittent GI symptoms of bloating, gas and pain, along with some constipation issues lately requiring lactulose on a regular basis. He was admitted to hospital with progressive uncontrolled abdominal pain, progressive shortness of breath. He is found to have bilateral progressive pleural effusion and is being sent for left-sided thoracentesis today. Currently he is status post thoracentesis left side, 1.5 liters, states that his breathing is better and abdomen also is beginning to feel better. Pain is under control at this time.   PAST MEDICAL HISTORY AND PAST SURGICAL HISTORY: Hypertension, hyperlipidemia, hyponatremia, possibly SIADH, GERD, diabetes mellitus, appendectomy, stage IV metastatic adenocarcinoma as described above.   FAMILY HISTORY: Noncontributory, remarkable for colon polyps.   SOCIAL HISTORY: Ex-smoker, quit recently. Occasional alcohol intake only. Lives with his wife.   ALLERGIES: No known drug allergies.   HOME MEDICATIONS: Aspirin 81 mg 2 tablets daily, baclofen 5 mg b.i.d., benazepril 10 mg daily, fentanyl patch 25 mcg q.72 hours, Lasix 20 mg b.i.d., glipizide extended-release 2.5 mg daily,  lactulose 30 mL t.i.d. p.r.n., Megace 800 mg daily, metformin 500 mg b.i.d., mirtazapine 15 mg at bedtime, Prilosec 20 mg b.i.d., Zofran 4 mg p.r.n., oxycodone 5 to 10 mg q.4-6 hours  p.r.n., potassium chloride 10 mEq daily, Rapaflo 8 mg capsule daily, senna 1 tablet at bedtime, simvastatin 20 mg at bedtime, Xanax 0.5 mg at bedtime.   REVIEW OF SYSTEMS:   CONSTITUTIONAL: Generalized weakness and fatigue on exertion. No fevers or chills.  HEENT: No headaches, dizziness at rest. No epistaxis, ear or jaw pain.  CARDIAC: Denies any angina, palpitation, orthopnea, or PND.  LUNGS: As in HPI. Has no cough, sputum, hemoptysis, or chest pain.  GASTROINTESTINAL: As in HPI. Currently denies any bright red blood in stools or melena.   GENITOURINARY: No dysuria or hematuria. Urine output is less.  MUSCULOSKELETAL: No new bone pains.  EXTREMITIES: Has mild swelling on and off, no pain.  NEUROLOGIC: No new focal weakness, seizures, or loss of consciousness.  SKIN: No new rashes or pruritus.  HEMATOLOGIC: No obvious bleeding symptoms.  ENDOCRINE: No polyuria or polydipsia.   PHYSICAL EXAMINATION: GENERAL: The patient is weak and tired looking, resting in bed on nasal cannula oxygen, otherwise alert and oriented x 4 and converses appropriately. No acute distress. Mild pallor.  VITAL SIGNS: 98.2, 82, 17, 95/55, 96% on 2 liters.  HEENT: Normocephalic, atraumatic. Extraocular movements intact. Sclerae anicteric.  NECK: Negative for lymphadenopathy.  CARDIOVASCULAR: S1, S2, regular rate and rhythm.  LUNGS: Bilateral decreased breath sounds, especially in the lower lobes, no rhonchi.  ABDOMEN: Mildly distended, tenderness present in the upper quadrants and epigastrium. Bowel sounds significantly decreased. No guarding or rigidity otherwise. No major ascites clinically.  EXTREMITIES: Trace edema.  NEUROLOGIC: Grossly nonfocal, moves all extremities spontaneously. Cranial  nerves seem intact.  SKIN: No generalized  rashes or major bruising.   LABORATORY AND RADIOLOGICAL DATA: WBC 10,900, hemoglobin 10.9, platelets 527, ANC 8900, creatinine 1.22, calcium 8.7. Abdominal x-ray shows prominent bowel loops in the lower abdomen; partial bowel obstruction cannot be excluded; stomach is moderately distended. CT scan of the abdomen and pelvis yesterday showed no acute findings in the abdomen or pelvis. Extensive omental caking consistent with metastatic adenocarcinoma. Small-volume abdominal and pelvic ascites identified. Large volume of stool in the ascending colon. CT chest showed no evidence of pulmonary embolus. There was slight progression of moderate-sized pleural effusion, new small pericardial effusion, gastric varices.   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 Since the patient had failed chemotherapy and treatment options are limited, along with the complicated situation of recurrent abdominal symptoms and omental caking, [facility] to see Dr. Shirleen Schirmer, medical oncologist, at [facility] for further treatment options. The patient, however, has gotten acutely sick and ended up in hospital here. Currently he does feel better with regards to shortness of breath, abdominal discomfort, and pain control after he had 1.5 liters left-sided thoracentesis. The patient and family are anxious to try and get to [facility] to discuss with medical oncology about treatment options, since all of these symptoms and most likely progressive effusions are also cancer-related issues only. Agree with ongoing supportive treatment. Continue on fentanyl patch along with oxycodone p.r.n. and morphine IV p.r.n. if pain is not under control. I have discussed with medical oncology  inpatient service at [facility], Dr. Tessie Eke (oncology fellow), who has accepted the patient once bed is available there. Attending physician is Dr. Janene Madeira. Will continue to follow while the patient is hospitalized here. The patient and family also were made aware of rapid progression of his malignancy and its complications that are ongoing at this time, and that it indicates that the overall prognosis is very poor. I have also discussed code status. He does not want any mechanical ventilation or resuscitation in case of cardiopulmonary failure/arrest. Will order DO NOT RESUSCITATE status accordingly. Will continue to follow.   Thank you for the referral. Please feel free to contact me if any additional questions.   ____________________________ Rhett Bannister Ma Hillock, MD srp:jcm D: 11/07/2013 17:16:23 ET T: 11/07/2013 17:41:22 ET JOB#: [id number]  cc: Rogelio Waynick R. Ma Hillock, MD, <Dictator> Alveta Heimlich MD ELECTRONICALLY SIGNED 11/07/2013 23:49

## 2015-01-05 NOTE — H&P (Signed)
PATIENT NAME:  Kurt Chapman, Kurt Chapman MR#:  [id number] DATE OF BIRTH:  [birth year]  DATE OF ADMISSION:  11/06/2013  PRIMARY CARE PHYSICIAN:  Jerrell Belfast, MD  ONCOLOGIST: Rhett Bannister. Ma Hillock, MD  CHIEF COMPLAINT: Shortness of breath.   HISTORY OF PRESENT ILLNESS: This is a 74 year old man who has been having shortness of breath for the past 4 or 5 weeks. He has been on and off Lasix, now back on Lasix. He did get dehydrated in between. He has some cough, worse in the a.m. He has pain in the epigastric area radiating to the back. He has constipation also starting on Thursday, no bowel movement from there. The pain in the epigastric area is constant, 4 out of 10 in intensity. Pain medication made it better, now it is a dull ache but today it was also stabbing. In the ER, he had a CT scan of the chest, abdomen and pelvis that showed large bilateral pleural effusions, no pulmonary embolism. CT scan of the abdomen and pelvis showed large volume of stool, unchanged from prior, extensive omental caking consistent with metastatic adenocarcinoma. Hospitalist services were contacted for further evaluation.   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 Diabetes, hypertension, hyperlipidemia, gastroesophageal reflux disease.   PAST SURGICAL HISTORY: Appendectomy.   ALLERGIES: No known drug allergies.   MEDICATIONS: Include aspirin 81 mg 2 tablets daily, baclofen 10 mg half tablet twice a day, benazepril 10 mg daily, fentanyl 12 mcg per hour every 72 hours, furosemide 20 mg twice a day, glipizide 2.5 mg extended-release daily, lactulose 30 mL 3 times a day as needed for constipation, Megace 40 mg/mL, 20 mL orally daily; metformin 500 mg twice a day, Remeron 15 mg at bedtime, omeprazole 20 mg twice a day, Zofran 4 mg every 4 hours as needed for nausea vomiting, oxycodone 5 mg 1 to 2 tablets every 4 to 6 hours as needed for pain, potassium chloride 10 mEq daily, Rapaflo 8 mg daily,  Sennalax 8.6 mg at bedtime, simvastatin 20 mg at bedtime, Xanax 0.5 mg 1 tablet at bedtime.   FAMILY HISTORY: Father died of CHF, also had blood clot disorder. Mother died of CVA, also had Parkinson's.   SOCIAL HISTORY: No smoking. No alcohol. No drug use. Chewed tobacco in the past. Worked with form work.  Lives with his wife.   REVIEW OF SYSTEMS: CONSTITUTIONAL: Positive for hot feeling. No fever. No chills. Positive for weight loss, 32 pounds. Positive for fatigue.  EYES: Does wear glasses.  EARS, NOSE, MOUTH AND THROAT: Decreased hearing. Positive for runny nose.  CARDIOVASCULAR: Positive for chest pain.  RESPIRATORY: Positive for shortness breath. Positive for cough, worse in the a.m., no hemoptysis.  GASTROINTESTINAL: Positive for nausea. No vomiting. Positive for abdominal pain. Positive for constipation. No bright red blood per rectum. No melena.  GENITOURINARY: No burning on urination or hematuria.  MUSCULOSKELETAL: Positive for back pain and with chemo gets leg pain.  INTEGUMENT: No rashes or eruptions.  NEUROLOGIC: No fainting or blackouts.  PSYCHIATRIC: No anxiety or depression.  ENDOCRINE: No thyroid problems.  HEMATOLOGIC AND LYMPHATIC: History of anemia.   PHYSICAL EXAMINATION: VITAL SIGNS: On presentation included a temperature 98, pulse 97, respirations 24, blood pressure 150/83, pulse ox 93% initially on room air.  GENERAL:  The patient is using accessory muscles to breathe, is slightly diaphoretic.  EYES: Conjunctivae and lids normal. Pupils equal, round and reactive to light. Extraocular muscles intact. No nystagmus.  EARS, NOSE, MOUTH AND THROAT: Tympanic  membraned blocked by wax. Nasal mucosa no erythema. Throat no erythema, no exudate seen. Lips and gums no lesions.  NECK: No JVD. No bruits. No lymphadenopathy. No thyromegaly. No thyroid nodules palpated.  RESPIRATORY: Decreased breath sounds bilaterally. Halfway up lung field decreased breath sounds, good air entry  upper lung fields.  CARDIOVASCULAR SYSTEM: S1 and S2 normal. No gallops, rubs or murmurs heard. Carotid upstroke 2+ bilaterally. No bruits. Dorsalis pedis pulses 2+ bilaterally. No edema of the lower extremity.  ABDOMEN: Soft, positive tenderness mostly in the epigastric area. No organomegaly/splenomegaly. Normoactive bowel sounds.  LYMPHATIC: No lymph nodes in the neck.  MUSCULOSKELETAL: No clubbing, edema or cyanosis.  SKIN: No ulcers or lesions seen.  NEUROLOGIC: Cranial nerves II through XII grossly intact. Deep tendon reflexes 1+ bilateral lower extremities.  PSYCHIATRIC: The patient is oriented to person, place and time.   LABORATORY, DIAGNOSTIC AND RADIOLOGICAL DATA:  INR 1.3. Troponin negative. Magnesium 1.8. Lipase 64. Glucose 128, BUN 10, creatinine 0.88, sodium 127, potassium 4.5, chloride 95, CO2 26, calcium 9.1. Liver function tests are normal range, albumin low at 3. White blood cell count 8.6, H and H 11.5 and 34.7, platelet count of 580. CT scan of the chest showed bilateral moderate pleural effusions, gastric varices, progression of peritoneal disease within the anterior abdomen, no evidence of pulmonary embolism. CT scan of the abdomen and pelvis showed a large volume of stool, extensive omental caking consistent with metastatic adenocarcinoma, small volume of ascites, large pleural effusions.   ASSESSMENT AND PLAN: 1.  Respiratory failure with using accessory muscles to breathe and air hunger secondary to bilateral pleural effusions. These are secondary to the metastatic adenocarcinoma. Will give oxygen supplementation. I did speak with interventional radiologist, Dr. Myrle Sheng, who recommended a thoracentesis in the a.m. on 1 side and then the day after another thoracentesis and see how he feels. He may end up needing Pleurx catheters. The patient is a full code. Fluid will continue to reaccumulate with his metastatic adenocarcinoma. Overall prognosis is poor.  2.  Epigastric pain  radiating to the back. Will keep on proton pump inhibitor. Continue pain medications.  3.  Constipation. Will give a dose of Relistor. If this is opioid constipation, this will have a bowel movement in 4 hours. Will also give Fleet enemas and standing-dose lactulose until a bowel movement.  4.  Diabetes. We will put on sliding scale and hold medications at this point.  5.  Hypertension. Continue lisinopril. Hold Lasix right now.  6.  Gastroesophageal reflux disease, on omeprazole.  7.  Metastatic adenocarcinoma, likely pancreas source. Overall prognosis is poor. We will get a palliative care consultation and consult Dr. Ma Hillock in the a.m.   TIME ON ADMISSION: 60 minutes. The patient is a full code.    ____________________________ Tana Conch. Leslye Peer, MD rjw:cs D: 11/06/2013 18:34:13 ET T: 11/06/2013 19:03:02 ET JOB#: [id number]  cc: Tana Conch. Leslye Peer, MD, <Dictator> Jerrell Belfast, MD Sandeep R. Ma Hillock, MD   Marisue Brooklyn MD ELECTRONICALLY SIGNED 11/08/2013 17:23

## 2015-03-01 IMAGING — CT CT NECK-CHEST W/ CM
2 series · 10 of 14 positions shown, 12 images · non-contrast
Comparison: none

REASON FOR EXAM: Left lower neck mass left axillary adenopathy
unintentional weight lost [place]...
COMMENTS:

PROCEDURE:     CT  - CT NECK AND CHEST W  - May 22, 2013  [DATE]
RESULT:     Or neck mass. Weight loss.
Comparison Study: CT chest 02/18/2011.
TECHNIQUE: A standard contrast enhanced CT obtained with 75 cc of
[id number]. Evaluation 3 dimensions performed on separate workstation
performed.

[Series 2: soft tissue · axial · 0.72mm/px · z∈[-416,-47]mm · 7 of 165 slices shown, 9 images]
[im 21/165  soft-tissue]
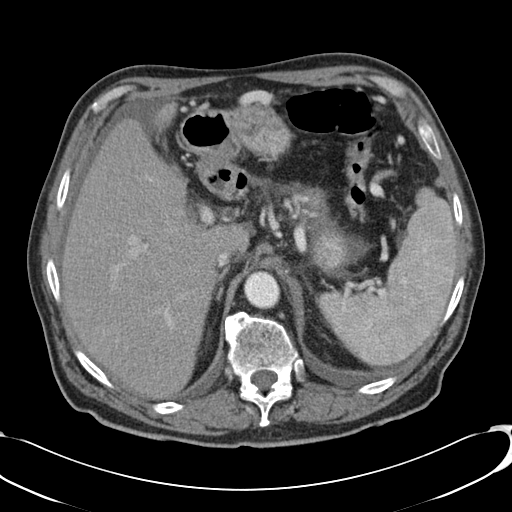
[im 21/165  bone]
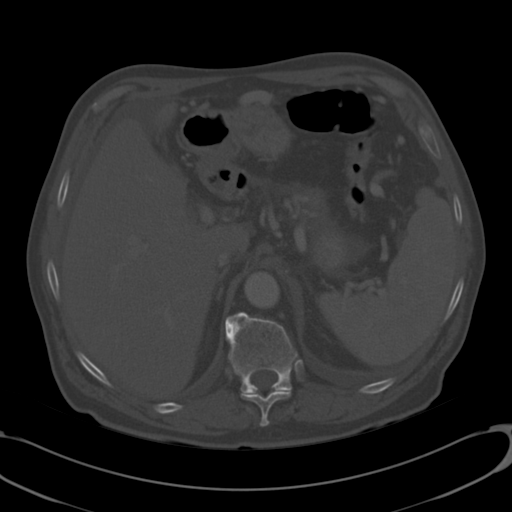
[im 42/165  bone]
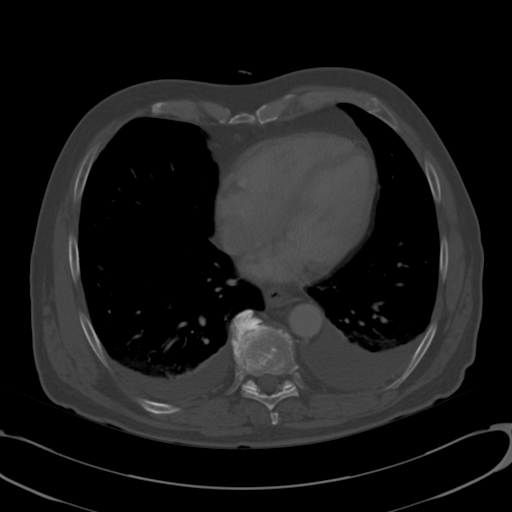
[im 62/165  bone]
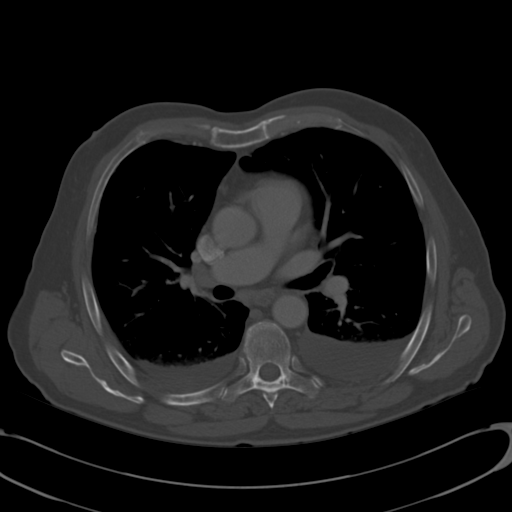
[im 83/165  bone]
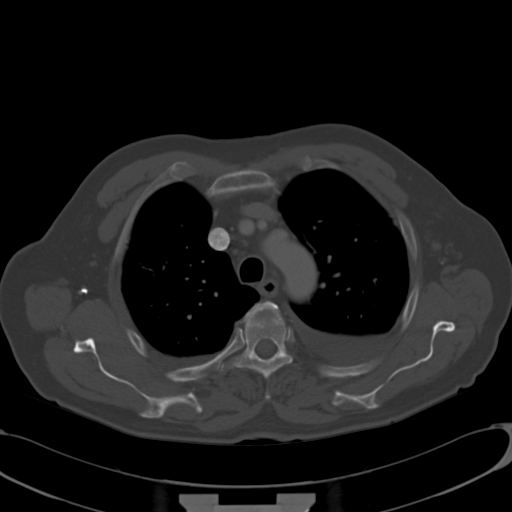
[im 103/165  soft-tissue]
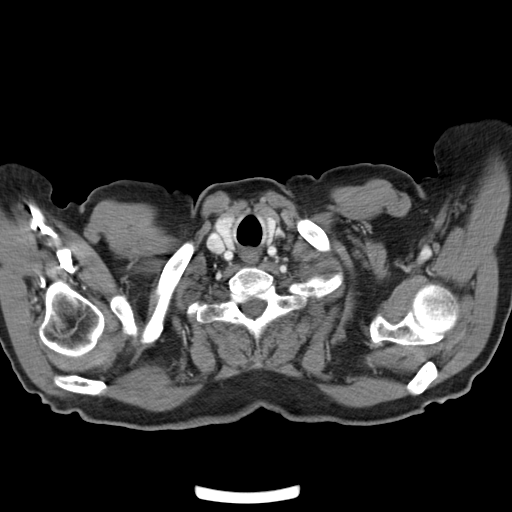
[im 103/165  bone]
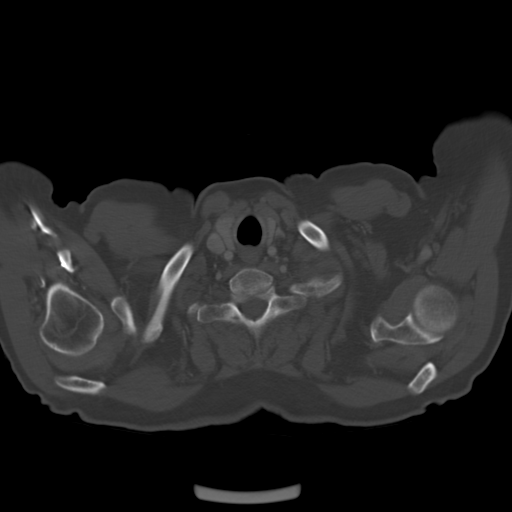
[im 124/165  bone]
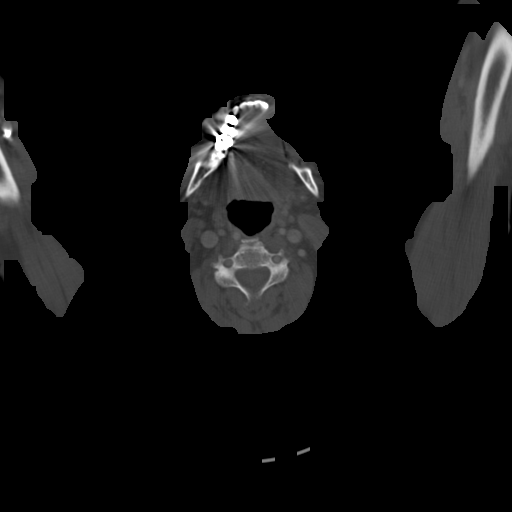
[im 144/165  bone]
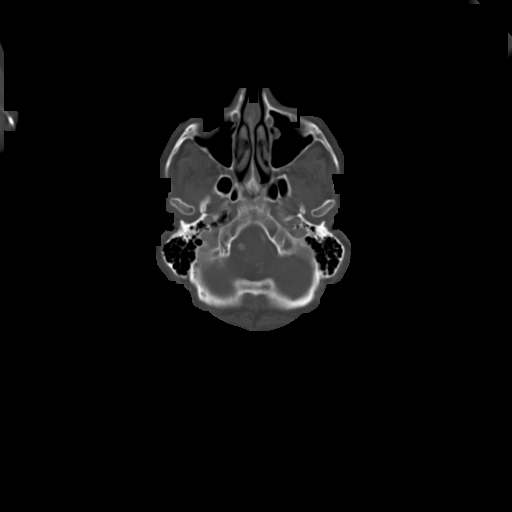

[Series 4: lung windows · axial · 0.72mm/px · z∈[-396,-240]mm · 3 of 105 slices shown]
[im 27/105  bone]
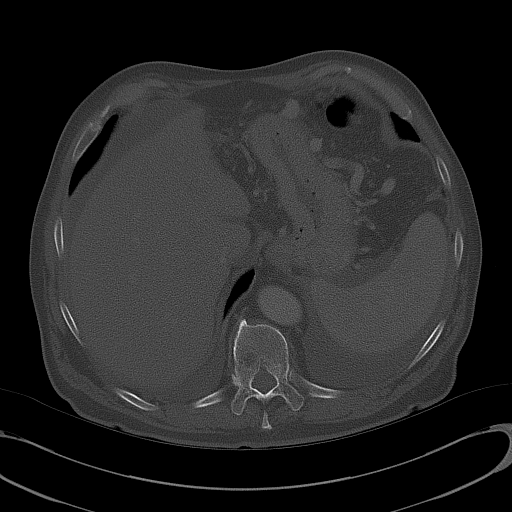
[im 53/105  bone]
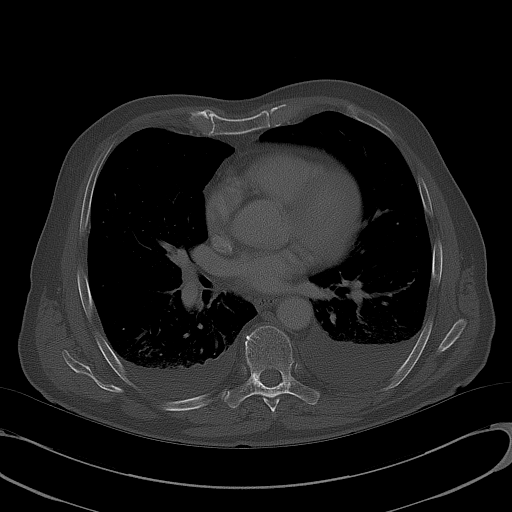
[im 79/105  bone]
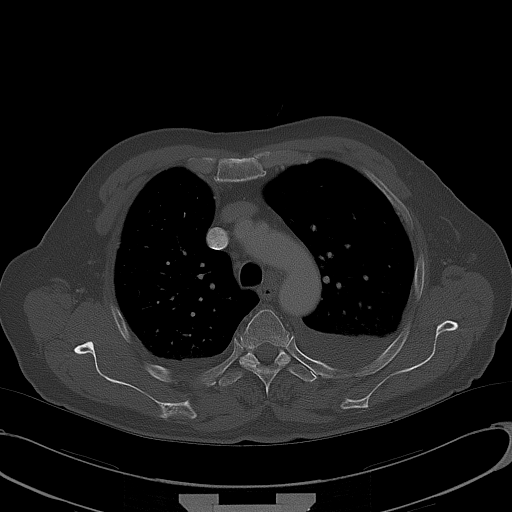

[10 of 14 positions shown; findings below may reference images not displayed]

FINDINGS: Paranasal sinuses are clear. Orbits are unremarkable. Nasopharynx,
oropharynx, larynx normal. 2 mm nodular left thyroid lobe, most likely a
cyst. Cervical airways widely patent. Salivary glands are normal.
Submandibular glands are normal. No significant cervical adenopathy.

Multiple small mediastinal and hilar lymph nodes are present. Largest lymph
node is in the AP window measures 1.3 cm in maximum diameter. Multiple left
axillary lymph nodes are present. The largest measures 1.5 cm in maximum
diameter. Bilateral small pleural effusions are present. Thoracic aorta and
pulmonary arteries are normal. Heart size normal. Large airways are patent.
Mild atelectasis in the lung bases. Adrenals normal. Mild ascites present.
IMPRESSION: 1. No focal cervical abnormality identified.
2. Multiple mediastinal, hilar, and left axillary small lymph nodes.
3. Small pleural effusions.
4. Mild ascites.

## 2015-03-04 IMAGING — PT NM PET TUM IMG LTD AREA
6 series · 25 of 25 positions shown · non-contrast
Comparison: none

REASON FOR EXAM: mediastinal and axillary adenopathy, omental thickening,
abnormal CA 19-9, eval
COMMENTS:   LMP: (Male)

PROCEDURE:     PET - PET/CT DX LYMPHOMA  - May 25, 2013 [DATE]
RESULT:     History: Adenopathy.
Comparison study: CT of 05/22/2013 and 05/16/2013.

[Series 3: ct wb 3.0 b30f · axial · 3.0mm · 0.98mm/px · z∈[-1474,-488]mm · 10 of 494 slices shown]
[im 1/494  soft-tissue]
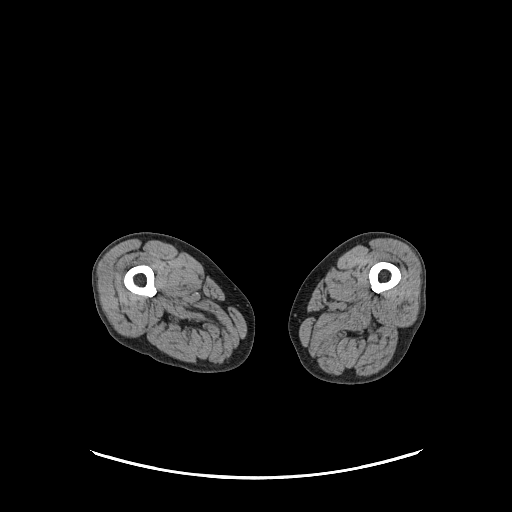
[im 55/494  soft-tissue]
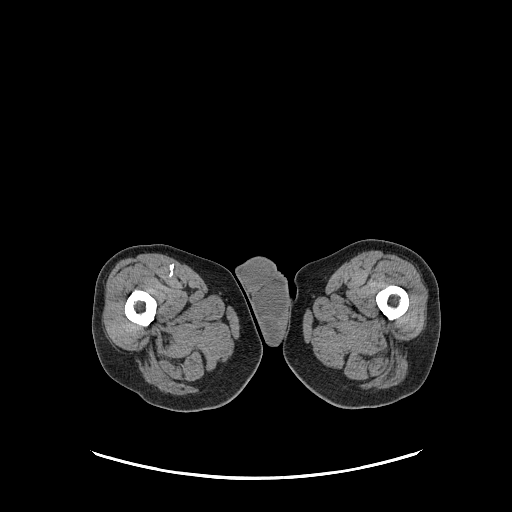
[im 110/494  soft-tissue]
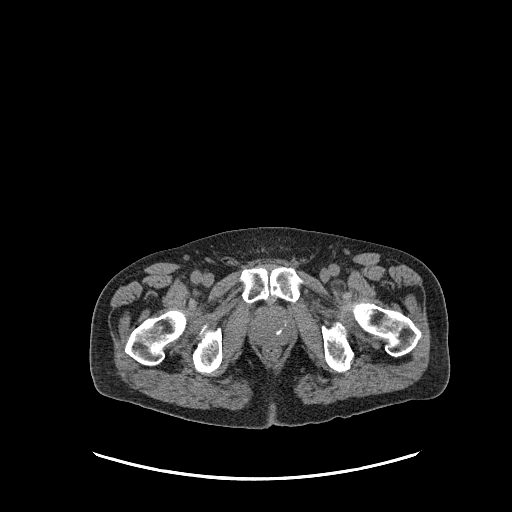
[im 165/494  soft-tissue]
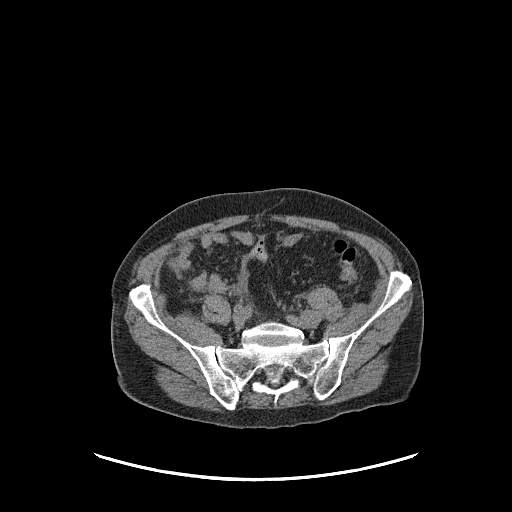
[im 220/494  soft-tissue]
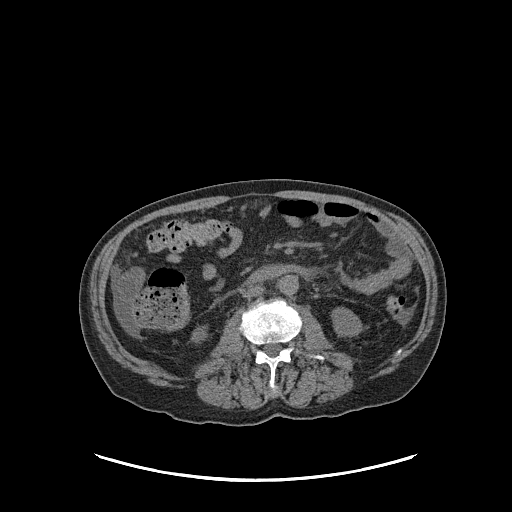
[im 274/494  soft-tissue]
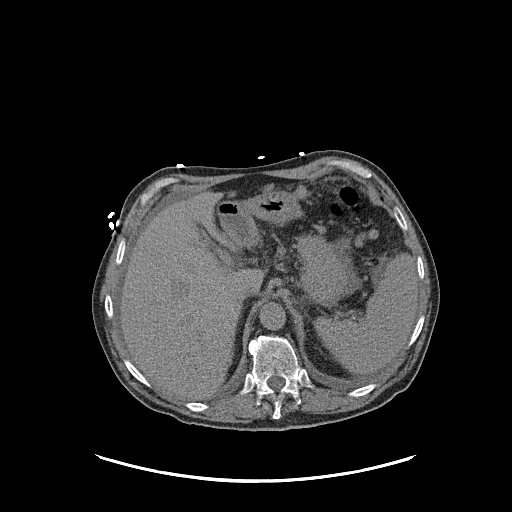
[im 329/494  soft-tissue]
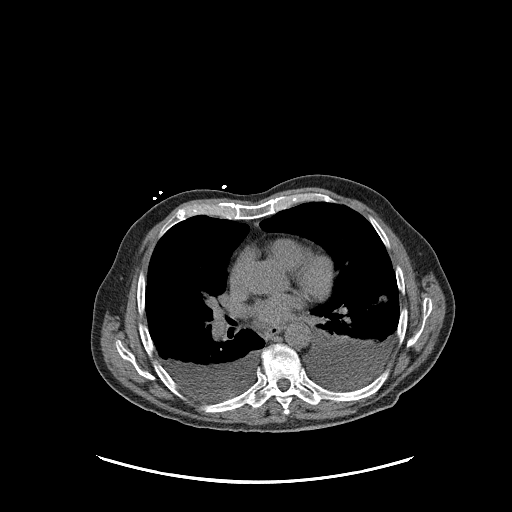
[im 384/494  soft-tissue]
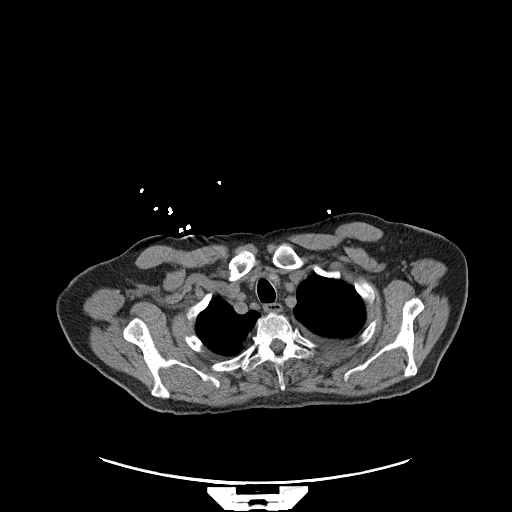
[im 439/494  soft-tissue]
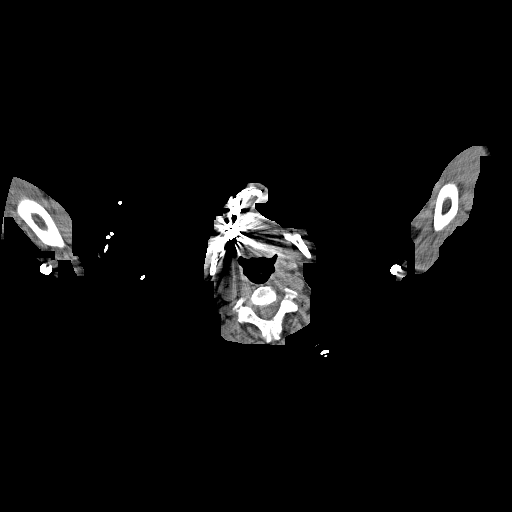
[im 494/494  soft-tissue]
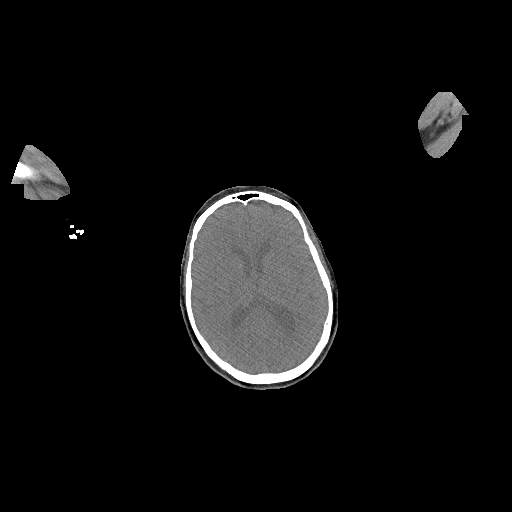

[Series 102: pet wb · axial · 5.0mm · 4.07mm/px · z∈[-1472,-488]mm · 7 of 329 slices shown]
[im 1/329]
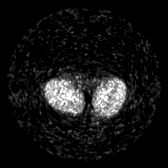
[im 55/329]
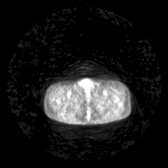
[im 110/329]
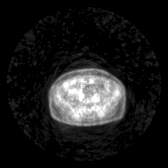
[im 165/329]
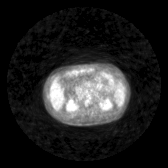
[im 219/329]
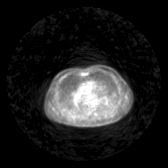
[im 274/329]
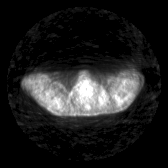
[im 329/329]
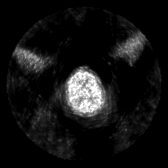

[Series 803: pet axial · 4 of 191 slices shown]
[im 1/191]
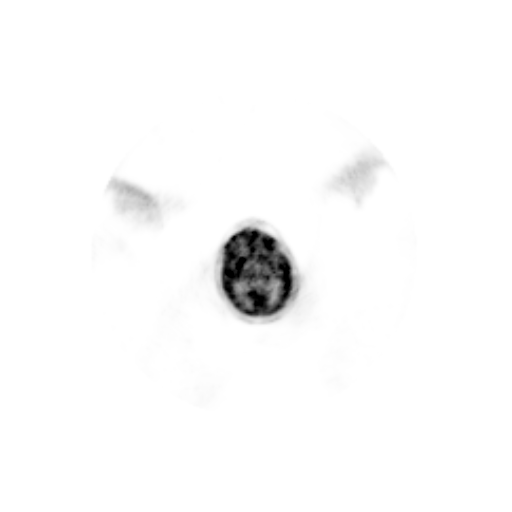
[im 64/191]
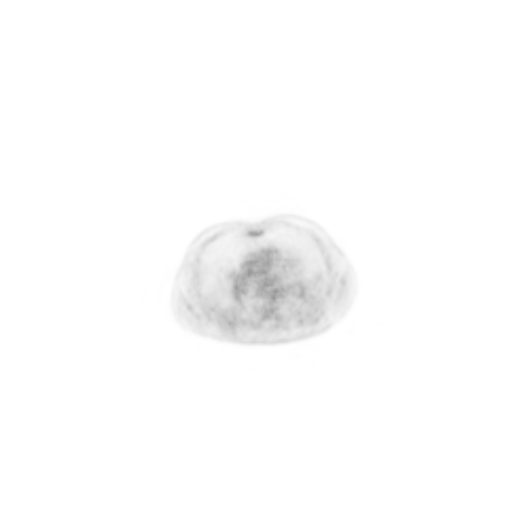
[im 127/191]
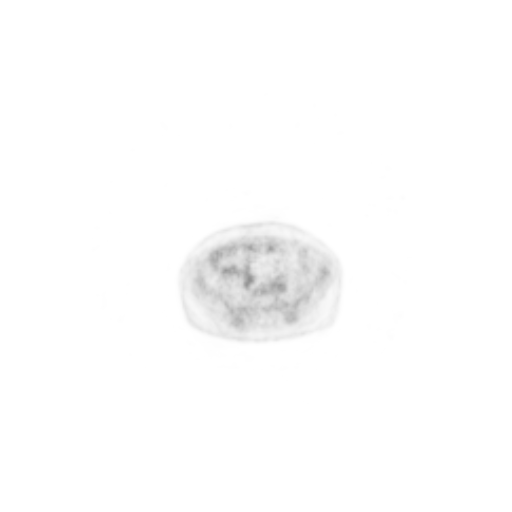
[im 191/191]
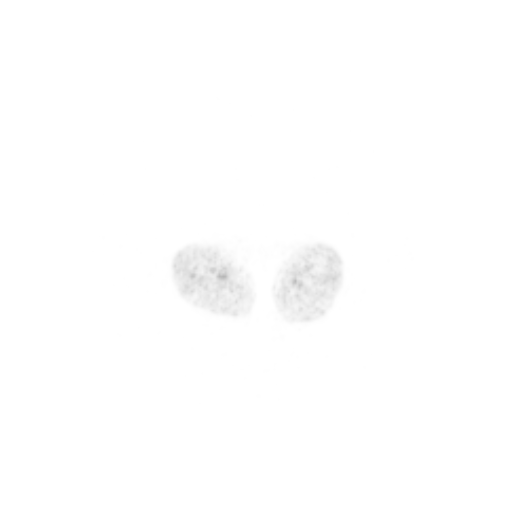

[Series 804: pet coronal · 1 of 54 slices shown]
[im 1/54]
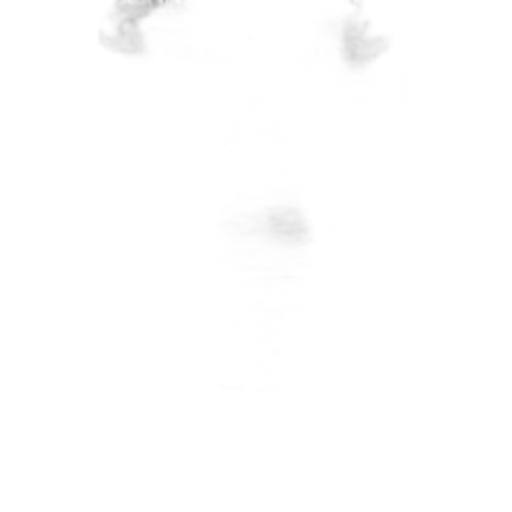

[Series 805: pet sagittal · 2 of 77 slices shown]
[im 1/77]
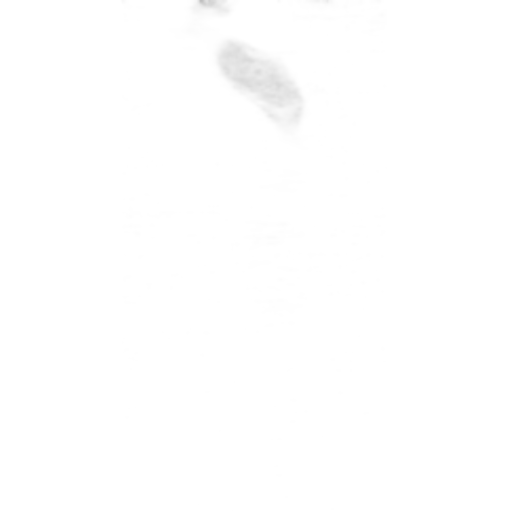
[im 77/77]
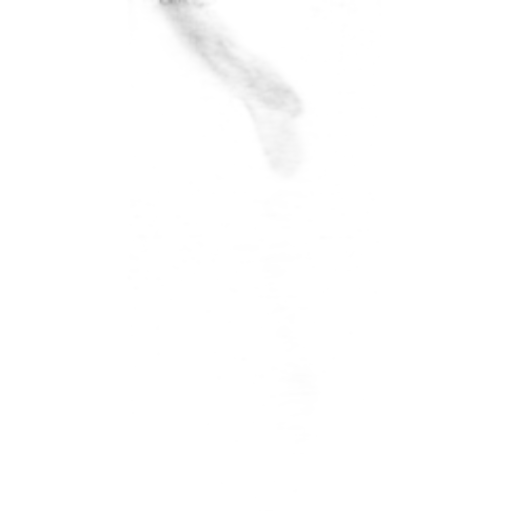

[Series 1024: mm oncology reading_mm oncology reading result ima · 1.31mm/px · 1 of 3 slices shown]
[im 1/3]
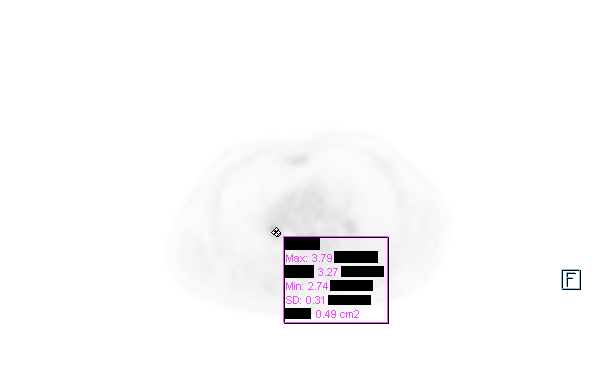

[25 of 25 positions shown; findings below may reference images not displayed]

FINDINGS: A following determination of fasting blood sugar 100 mg/dL
mCi of F-18 FDG administered an whole-body PET CT obtained. CT obtained for
attenuation correction and fusion. Small PET positive hilar and mediastinal
lymph nodes are present with greatest mean SUV of 3.3. These findings
suggest the possibility malignant adenopathy. Mesenteric implants are PET
positive with SUV levels up to 4. No abnormal bony activity.
IMPRESSION: PET positive mediastinal and hilar adenopathy. PET positive
mesenteric implants .
